# Patient Record
Sex: Male | Born: 1975 | Race: White | Hispanic: No | Marital: Married | Smoking: Former smoker
Health system: Southern US, Community
[De-identification: ages and names within clinical notes are randomized; demographics above are authoritative.]

## PROBLEM LIST (undated history)

## (undated) DIAGNOSIS — N200 Calculus of kidney: Secondary | ICD-10-CM

## (undated) HISTORY — DX: Calculus of kidney: N20.0

---

## 2005-10-31 DIAGNOSIS — N200 Calculus of kidney: Secondary | ICD-10-CM

## 2005-10-31 HISTORY — DX: Calculus of kidney: N20.0

## 2016-10-20 ENCOUNTER — Telehealth: Payer: Self-pay | Admitting: General Practice

## 2016-10-20 NOTE — Telephone Encounter (Signed)
Patient would like to know if Dr. Posey ReaPlotnikov would take him and his wife on as a patient b/c his wife does not speak english good but does speak [place].

## 2016-11-02 NOTE — Telephone Encounter (Signed)
Ok Thx 

## 2016-11-08 NOTE — Telephone Encounter (Signed)
Left message for patient to call back to schedule.  °

## 2016-11-24 ENCOUNTER — Ambulatory Visit: Payer: Self-pay | Admitting: Internal Medicine

## 2016-11-24 ENCOUNTER — Encounter: Payer: Self-pay | Admitting: Internal Medicine

## 2016-11-24 ENCOUNTER — Ambulatory Visit (INDEPENDENT_AMBULATORY_CARE_PROVIDER_SITE_OTHER): Payer: Managed Care, Other (non HMO) | Admitting: Internal Medicine

## 2016-11-24 ENCOUNTER — Other Ambulatory Visit (INDEPENDENT_AMBULATORY_CARE_PROVIDER_SITE_OTHER): Payer: Managed Care, Other (non HMO)

## 2016-11-24 DIAGNOSIS — Z0001 Encounter for general adult medical examination with abnormal findings: Secondary | ICD-10-CM

## 2016-11-24 DIAGNOSIS — R197 Diarrhea, unspecified: Secondary | ICD-10-CM | POA: Insufficient documentation

## 2016-11-24 DIAGNOSIS — Z0000 Encounter for general adult medical examination without abnormal findings: Secondary | ICD-10-CM

## 2016-11-24 DIAGNOSIS — N200 Calculus of kidney: Secondary | ICD-10-CM

## 2016-11-24 DIAGNOSIS — G8929 Other chronic pain: Secondary | ICD-10-CM

## 2016-11-24 DIAGNOSIS — R1013 Epigastric pain: Secondary | ICD-10-CM

## 2016-11-24 LAB — BASIC METABOLIC PANEL
BUN: 16 mg/dL (ref 6–23)
CO2: 30 meq/L (ref 19–32)
Calcium: 9.8 mg/dL (ref 8.4–10.5)
Chloride: 107 mEq/L (ref 96–112)
Creatinine, Ser: 0.97 mg/dL (ref 0.40–1.50)
GFR: 91.6 mL/min (ref >60.00)
GLUCOSE: 105 mg/dL — AB (ref 70–99)
POTASSIUM: 4.7 meq/L (ref 3.5–5.1)
SODIUM: 143 meq/L (ref 135–145)

## 2016-11-24 LAB — CBC WITH DIFFERENTIAL/PLATELET
Basophils Absolute: 0 10*3/uL (ref 0.0–0.1)
Basophils Relative: 0.4 % (ref 0.0–3.0)
EOS PCT: 1.1 % (ref 0.0–5.0)
Eosinophils Absolute: 0.1 10*3/uL (ref 0.0–0.7)
HCT: 45.9 % (ref 39.0–52.0)
Hemoglobin: 16 g/dL (ref 13.0–17.0)
LYMPHS ABS: 2.2 10*3/uL (ref 0.7–4.0)
Lymphocytes Relative: 36.9 % (ref 12.0–46.0)
MCHC: 35 g/dL (ref 30.0–36.0)
MCV: 84.6 fl (ref 78.0–100.0)
MONOS PCT: 9.2 % (ref 3.0–12.0)
Monocytes Absolute: 0.6 10*3/uL (ref 0.1–1.0)
NEUTROS ABS: 3.2 10*3/uL (ref 1.4–7.7)
NEUTROS PCT: 52.4 % (ref 43.0–77.0)
Platelets: 192 10*3/uL (ref 150.0–400.0)
RBC: 5.42 Mil/uL (ref 4.22–5.81)
RDW: 12.6 % (ref 11.5–15.5)
WBC: 6.1 10*3/uL (ref 4.0–10.5)

## 2016-11-24 LAB — LIPID PANEL
CHOLESTEROL: 171 mg/dL (ref 0–200)
HDL: 34.4 mg/dL — ABNORMAL LOW (ref >39.00)
LDL Cholesterol: 100 mg/dL — ABNORMAL HIGH (ref 0–99)
NonHDL: 136.86
Total CHOL/HDL Ratio: 5
Triglycerides: 186 mg/dL — ABNORMAL HIGH (ref 0.0–149.0)
VLDL: 37.2 mg/dL (ref 0.0–40.0)

## 2016-11-24 LAB — HEPATIC FUNCTION PANEL
ALBUMIN: 4.6 g/dL (ref 3.5–5.2)
ALT: 26 U/L (ref 0–53)
AST: 16 U/L (ref 0–37)
Alkaline Phosphatase: 82 U/L (ref 39–117)
Bilirubin, Direct: 0.1 mg/dL (ref 0.0–0.3)
TOTAL PROTEIN: 7.6 g/dL (ref 6.0–8.3)
Total Bilirubin: 0.6 mg/dL (ref 0.2–1.2)

## 2016-11-24 LAB — URINALYSIS
BILIRUBIN URINE: NEGATIVE
Hgb urine dipstick: NEGATIVE
KETONES UR: NEGATIVE
Leukocytes, UA: NEGATIVE
Nitrite: NEGATIVE
PH: 7 (ref 5.0–8.0)
SPECIFIC GRAVITY, URINE: 1.02 (ref 1.000–1.030)
TOTAL PROTEIN, URINE-UPE24: NEGATIVE
Urine Glucose: NEGATIVE
Urobilinogen, UA: 0.2 (ref 0.0–1.0)

## 2016-11-24 LAB — TSH: TSH: 0.97 u[IU]/mL (ref 0.35–4.50)

## 2016-11-24 MED ORDER — VITAMIN D 1000 UNITS PO TABS: 1000.0000 [IU] | ORAL_TABLET | Freq: Every day | ORAL | 3 refills | Status: AC

## 2016-11-24 NOTE — Assessment & Plan Note (Signed)
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

## 2016-11-24 NOTE — Assessment & Plan Note (Signed)
Episodic - ?lactose intol vs other discussed

## 2016-11-24 NOTE — Progress Notes (Signed)
   Subjective:  Patient ID: Pryor Ochoagor Allshouse, male    DOB: 03-21-1976  Age: 41 y.o. MRN: [id number]  CC: Establish Care   HPI Pryor Ochoagor Bangert presents for a well exam H/o R kidney stone C/o occ abd pain and diarrhea off and on after eating out - long time  No outpatient prescriptions prior to visit.   No facility-administered medications prior to visit.     ROS Review of Systems  Constitutional: Negative for appetite change, fatigue and unexpected weight change.  HENT: Negative for congestion, nosebleeds, sneezing, sore throat and trouble swallowing.   Eyes: Negative for itching and visual disturbance.  Respiratory: Negative for cough.   Cardiovascular: Negative for chest pain, palpitations and leg swelling.  Gastrointestinal: Positive for abdominal pain and diarrhea. Negative for abdominal distention, blood in stool and nausea.  Genitourinary: Negative for frequency and hematuria.  Musculoskeletal: Negative for back pain, gait problem, joint swelling and neck pain.  Skin: Negative for rash.  Neurological: Negative for dizziness, tremors, speech difficulty and weakness.  Psychiatric/Behavioral: Negative for agitation, dysphoric mood, sleep disturbance and suicidal ideas. The patient is not nervous/anxious.     Objective:  BP 140/90   Pulse 81   Ht 5' 9.5" (1.765 m)   Wt 211 lb (95.7 kg)   SpO2 98%   BMI 30.71 kg/m   BP Readings from Last 3 Encounters:  11/24/16 140/90    Wt Readings from Last 3 Encounters:  11/24/16 211 lb (95.7 kg)    Physical Exam  Constitutional: He is oriented to person, place, and time. He appears well-developed. No distress.  NAD  HENT:  Mouth/Throat: Oropharynx is clear and moist.  Eyes: Conjunctivae are normal. Pupils are equal, round, and reactive to light.  Neck: Normal range of motion. No JVD present. No thyromegaly present.  Cardiovascular: Normal rate, regular rhythm, normal heart sounds and intact distal pulses.  Exam reveals no gallop  and no friction rub.   No murmur heard. Pulmonary/Chest: Effort normal and breath sounds normal. No respiratory distress. He has no wheezes. He has no rales. He exhibits no tenderness.  Abdominal: Soft. Bowel sounds are normal. He exhibits no distension and no mass. There is no tenderness. There is no rebound and no guarding.  Musculoskeletal: Normal range of motion. He exhibits no edema or tenderness.  Lymphadenopathy:    He has no cervical adenopathy.  Neurological: He is alert and oriented to person, place, and time. He has normal reflexes. No cranial nerve deficit. He exhibits normal muscle tone. He displays a negative Romberg sign. Coordination and gait normal.  Skin: Skin is warm and dry. No rash noted.  Psychiatric: He has a normal mood and affect. His behavior is normal. Judgment and thought content normal.    No results found for: WBC, HGB, HCT, PLT, GLUCOSE, CHOL, TRIG, HDL, LDLDIRECT, LDLCALC, ALT, AST, NA, K, CL, CREATININE, BUN, CO2, TSH, PSA, INR, GLUF, HGBA1C, MICROALBUR  Patient was never admitted.  Assessment & Plan:   There are no diagnoses linked to this encounter. Mr. Tacey RuizZheldakov does not currently have medications on file.  No orders of the defined types were placed in this encounter.    Follow-up: No Follow-up on file.  Sonda PrimesAlex Willliam Pettet, MD

## 2016-11-24 NOTE — Patient Instructions (Addendum)
OTC Nexium   Check for Helicobacter pylori  Lactose intolerance Radiation dose

## 2016-11-24 NOTE — Assessment & Plan Note (Addendum)
We discussed age appropriate health related issues, including available/recomended screening tests and vaccinations. We discussed a need for adhering to healthy diet and exercise. Labs/EKG were reviewed/ordered. All questions were answered. H/o XRT exposure in [place] - he will calculate the dose

## 2016-11-24 NOTE — Progress Notes (Signed)
Pre visit review using our clinic review tool, if applicable. No additional management support is needed unless otherwise documented below in the visit note. 

## 2016-11-24 NOTE — Assessment & Plan Note (Addendum)
2007 R W/u in [place]. No relapse

## 2017-11-30 ENCOUNTER — Other Ambulatory Visit (INDEPENDENT_AMBULATORY_CARE_PROVIDER_SITE_OTHER): Payer: Managed Care, Other (non HMO)

## 2017-11-30 ENCOUNTER — Ambulatory Visit (INDEPENDENT_AMBULATORY_CARE_PROVIDER_SITE_OTHER): Payer: Managed Care, Other (non HMO) | Admitting: Internal Medicine

## 2017-11-30 ENCOUNTER — Encounter: Payer: Self-pay | Admitting: Internal Medicine

## 2017-11-30 VITALS — BP 122/78 | HR 68 | Temp 97.9°F | Ht 69.5 in | Wt 209.0 lb

## 2017-11-30 DIAGNOSIS — Z23 Encounter for immunization: Secondary | ICD-10-CM

## 2017-11-30 DIAGNOSIS — E785 Hyperlipidemia, unspecified: Secondary | ICD-10-CM

## 2017-11-30 DIAGNOSIS — Z0000 Encounter for general adult medical examination without abnormal findings: Secondary | ICD-10-CM

## 2017-11-30 LAB — BASIC METABOLIC PANEL
BUN: 20 mg/dL (ref 6–23)
CHLORIDE: 105 meq/L (ref 96–112)
CO2: 28 meq/L (ref 19–32)
Calcium: 9.5 mg/dL (ref 8.4–10.5)
Creatinine, Ser: 0.95 mg/dL (ref 0.40–1.50)
GFR: 93.34 mL/min (ref >60.00)
GLUCOSE: 109 mg/dL — AB (ref 70–99)
Potassium: 4.1 mEq/L (ref 3.5–5.1)
SODIUM: 140 meq/L (ref 135–145)

## 2017-11-30 LAB — HEPATIC FUNCTION PANEL
ALBUMIN: 4.5 g/dL (ref 3.5–5.2)
ALK PHOS: 76 U/L (ref 39–117)
ALT: 33 U/L (ref 0–53)
AST: 18 U/L (ref 0–37)
BILIRUBIN TOTAL: 0.8 mg/dL (ref 0.2–1.2)
Bilirubin, Direct: 0.2 mg/dL (ref 0.0–0.3)
Total Protein: 7.7 g/dL (ref 6.0–8.3)

## 2017-11-30 LAB — CBC WITH DIFFERENTIAL/PLATELET
BASOS PCT: 0.4 % (ref 0.0–3.0)
Basophils Absolute: 0 10*3/uL (ref 0.0–0.1)
EOS PCT: 1.7 % (ref 0.0–5.0)
Eosinophils Absolute: 0.1 10*3/uL (ref 0.0–0.7)
HEMATOCRIT: 46.8 % (ref 39.0–52.0)
HEMOGLOBIN: 15.7 g/dL (ref 13.0–17.0)
LYMPHS ABS: 2.2 10*3/uL (ref 0.7–4.0)
Lymphocytes Relative: 39.5 % (ref 12.0–46.0)
MCHC: 33.6 g/dL (ref 30.0–36.0)
MCV: 87.3 fl (ref 78.0–100.0)
MONOS PCT: 8.6 % (ref 3.0–12.0)
Monocytes Absolute: 0.5 10*3/uL (ref 0.1–1.0)
Neutro Abs: 2.8 10*3/uL (ref 1.4–7.7)
Neutrophils Relative %: 49.8 % (ref 43.0–77.0)
PLATELETS: 191 10*3/uL (ref 150.0–400.0)
RBC: 5.36 Mil/uL (ref 4.22–5.81)
RDW: 12.8 % (ref 11.5–15.5)
WBC: 5.6 10*3/uL (ref 4.0–10.5)

## 2017-11-30 LAB — LIPID PANEL
CHOLESTEROL: 197 mg/dL (ref 0–200)
HDL: 40.1 mg/dL (ref >39.00)
LDL Cholesterol: 140 mg/dL — ABNORMAL HIGH (ref 0–99)
NONHDL: 157.08
Total CHOL/HDL Ratio: 5
Triglycerides: 86 mg/dL (ref 0.0–149.0)
VLDL: 17.2 mg/dL (ref 0.0–40.0)

## 2017-11-30 LAB — URINALYSIS
BILIRUBIN URINE: NEGATIVE
HGB URINE DIPSTICK: NEGATIVE
KETONES UR: NEGATIVE
Leukocytes, UA: NEGATIVE
Nitrite: NEGATIVE
Specific Gravity, Urine: 1.025 (ref 1.000–1.030)
TOTAL PROTEIN, URINE-UPE24: NEGATIVE
URINE GLUCOSE: NEGATIVE
UROBILINOGEN UA: 0.2 (ref 0.0–1.0)
pH: 6.5 (ref 5.0–8.0)

## 2017-11-30 LAB — TSH: TSH: 0.92 u[IU]/mL (ref 0.35–4.50)

## 2017-11-30 NOTE — Progress Notes (Signed)
Subjective:  Patient ID: Adam Romero, male    DOB: December 01, 1975  Age: 42 y.o. MRN: [id number]  CC: No chief complaint on file.   HPI Ulrich Soules presents for a well exam  No outpatient medications prior to visit.   No facility-administered medications prior to visit.     ROS Review of Systems  Constitutional: Negative for appetite change, fatigue and unexpected weight change.  HENT: Negative for congestion, nosebleeds, sneezing, sore throat and trouble swallowing.   Eyes: Negative for itching and visual disturbance.  Respiratory: Negative for cough.   Cardiovascular: Negative for chest pain, palpitations and leg swelling.  Gastrointestinal: Negative for abdominal distention, blood in stool, diarrhea and nausea.  Genitourinary: Negative for frequency and hematuria.  Musculoskeletal: Negative for back pain, gait problem, joint swelling and neck pain.  Skin: Negative for rash.  Neurological: Negative for dizziness, tremors, speech difficulty and weakness.  Psychiatric/Behavioral: Negative for agitation, dysphoric mood and sleep disturbance. The patient is not nervous/anxious.     Objective:  BP 122/78 (BP Location: Left Arm, Patient Position: Sitting, Cuff Size: Large)   Pulse 68   Temp 97.9 F (36.6 C) (Oral)   Ht 5' 9.5" (1.765 m)   Wt 209 lb (94.8 kg)   SpO2 99%   BMI 30.42 kg/m   BP Readings from Last 3 Encounters:  11/30/17 122/78  11/24/16 140/90    Wt Readings from Last 3 Encounters:  11/30/17 209 lb (94.8 kg)  11/24/16 211 lb (95.7 kg)    Physical Exam  Constitutional: He is oriented to person, place, and time. He appears well-developed. No distress.  NAD  HENT:  Mouth/Throat: Oropharynx is clear and moist.  Eyes: Conjunctivae are normal. Pupils are equal, round, and reactive to light.  Neck: Normal range of motion. No JVD present. No thyromegaly present.  Cardiovascular: Normal rate, regular rhythm, normal heart sounds and intact distal pulses.  Exam reveals no gallop and no friction rub.  No murmur heard. Pulmonary/Chest: Effort normal and breath sounds normal. No respiratory distress. He has no wheezes. He has no rales. He exhibits no tenderness.  Abdominal: Soft. Bowel sounds are normal. He exhibits no distension and no mass. There is no tenderness. There is no rebound and no guarding.  Musculoskeletal: Normal range of motion. He exhibits no edema or tenderness.  Lymphadenopathy:    He has no cervical adenopathy.  Neurological: He is alert and oriented to person, place, and time. He has normal reflexes. No cranial nerve deficit. He exhibits normal muscle tone. He displays a negative Romberg sign. Coordination and gait normal.  Skin: Skin is warm and dry. No rash noted.  Psychiatric: He has a normal mood and affect. His behavior is normal. Judgment and thought content normal.  self exam - testes  Lab Results  Component Value Date   WBC 6.1 11/24/2016   HGB 16.0 11/24/2016   HCT 45.9 11/24/2016   PLT 192.0 11/24/2016   GLUCOSE 105 (H) 11/24/2016   CHOL 171 11/24/2016   TRIG 186.0 (H) 11/24/2016   HDL 34.40 (L) 11/24/2016   LDLCALC 100 (H) 11/24/2016   ALT 26 11/24/2016   AST 16 11/24/2016   NA 143 11/24/2016   K 4.7 11/24/2016   CL 107 11/24/2016   CREATININE 0.97 11/24/2016   BUN 16 11/24/2016   CO2 30 11/24/2016   TSH 0.97 11/24/2016    Patient was never admitted.  Assessment & Plan:   There are no diagnoses linked to this encounter. Bastien Colgate  does not currently have medications on file.  No orders of the defined types were placed in this encounter.    Follow-up: No Follow-up on file.  Sonda PrimesAlex Bernardette Waldron, MD

## 2017-11-30 NOTE — Assessment & Plan Note (Addendum)
Labs  We discussed age appropriate health related issues, including available/recomended screening tests and vaccinations. We discussed a need for adhering to healthy diet and exercise. Labs/EKG were reviewed/ordered. All questions were answered.

## 2017-12-23 ENCOUNTER — Encounter: Payer: Self-pay | Admitting: Internal Medicine

## 2018-12-06 ENCOUNTER — Encounter: Payer: Self-pay | Admitting: Internal Medicine

## 2018-12-06 ENCOUNTER — Ambulatory Visit (INDEPENDENT_AMBULATORY_CARE_PROVIDER_SITE_OTHER): Payer: Managed Care, Other (non HMO) | Admitting: Internal Medicine

## 2018-12-06 ENCOUNTER — Other Ambulatory Visit (INDEPENDENT_AMBULATORY_CARE_PROVIDER_SITE_OTHER): Payer: Managed Care, Other (non HMO)

## 2018-12-06 DIAGNOSIS — Z0000 Encounter for general adult medical examination without abnormal findings: Secondary | ICD-10-CM | POA: Diagnosis not present

## 2018-12-06 LAB — BASIC METABOLIC PANEL
BUN: 15 mg/dL (ref 6–23)
CO2: 29 meq/L (ref 19–32)
Calcium: 9.9 mg/dL (ref 8.4–10.5)
Chloride: 105 mEq/L (ref 96–112)
Creatinine, Ser: 1.04 mg/dL (ref 0.40–1.50)
GFR: 78.71 mL/min (ref >60.00)
Glucose, Bld: 101 mg/dL — ABNORMAL HIGH (ref 70–99)
Potassium: 4.9 mEq/L (ref 3.5–5.1)
Sodium: 141 mEq/L (ref 135–145)

## 2018-12-06 LAB — URINALYSIS
Bilirubin Urine: NEGATIVE
Hgb urine dipstick: NEGATIVE
KETONES UR: NEGATIVE
LEUKOCYTES UA: NEGATIVE
Nitrite: NEGATIVE
Specific Gravity, Urine: 1.02 (ref 1.000–1.030)
Total Protein, Urine: NEGATIVE
Urine Glucose: NEGATIVE
Urobilinogen, UA: 0.2 (ref 0.0–1.0)
pH: 7 (ref 5.0–8.0)

## 2018-12-06 LAB — HEPATIC FUNCTION PANEL
ALBUMIN: 4.7 g/dL (ref 3.5–5.2)
ALT: 31 U/L (ref 0–53)
AST: 19 U/L (ref 0–37)
Alkaline Phosphatase: 78 U/L (ref 39–117)
Bilirubin, Direct: 0.1 mg/dL (ref 0.0–0.3)
TOTAL PROTEIN: 7.2 g/dL (ref 6.0–8.3)
Total Bilirubin: 0.7 mg/dL (ref 0.2–1.2)

## 2018-12-06 LAB — CBC WITH DIFFERENTIAL/PLATELET
BASOS ABS: 0 10*3/uL (ref 0.0–0.1)
Basophils Relative: 0.4 % (ref 0.0–3.0)
EOS ABS: 0.1 10*3/uL (ref 0.0–0.7)
Eosinophils Relative: 1.9 % (ref 0.0–5.0)
HCT: 47.7 % (ref 39.0–52.0)
Hemoglobin: 16.2 g/dL (ref 13.0–17.0)
Lymphocytes Relative: 36.2 % (ref 12.0–46.0)
Lymphs Abs: 2.2 10*3/uL (ref 0.7–4.0)
MCHC: 34 g/dL (ref 30.0–36.0)
MCV: 86.3 fl (ref 78.0–100.0)
MONO ABS: 0.5 10*3/uL (ref 0.1–1.0)
Monocytes Relative: 8.6 % (ref 3.0–12.0)
Neutro Abs: 3.2 10*3/uL (ref 1.4–7.7)
Neutrophils Relative %: 52.9 % (ref 43.0–77.0)
Platelets: 187 10*3/uL (ref 150.0–400.0)
RBC: 5.53 Mil/uL (ref 4.22–5.81)
RDW: 13.3 % (ref 11.5–15.5)
WBC: 6 10*3/uL (ref 4.0–10.5)

## 2018-12-06 LAB — LIPID PANEL
Cholesterol: 175 mg/dL (ref 0–200)
HDL: 32.7 mg/dL — ABNORMAL LOW (ref >39.00)
LDL Cholesterol: 117 mg/dL — ABNORMAL HIGH (ref 0–99)
NonHDL: 142.32
Total CHOL/HDL Ratio: 5
Triglycerides: 128 mg/dL (ref 0.0–149.0)
VLDL: 25.6 mg/dL (ref 0.0–40.0)

## 2018-12-06 LAB — TSH: TSH: 1.05 u[IU]/mL (ref 0.35–4.50)

## 2018-12-06 NOTE — Assessment & Plan Note (Signed)
We discussed age appropriate health related issues, including available/recomended screening tests and vaccinations. We discussed a need for adhering to healthy diet and exercise. Labs/EKG were reviewed/ordered. All questions were answered.  Remote h/o XRT exposure in [place] - he will calculate the dose

## 2018-12-06 NOTE — Progress Notes (Signed)
Subjective:  Patient ID: Adam Romero, male    DOB: 08-03-1976  Age: 43 y.o. MRN: [id number]  CC: No chief complaint on file.   HPI Adam Romero presents for a well exam  No outpatient medications prior to visit.   No facility-administered medications prior to visit.     ROS: Review of Systems  Constitutional: Negative for appetite change, fatigue and unexpected weight change.  HENT: Negative for congestion, nosebleeds, sneezing, sore throat and trouble swallowing.   Eyes: Negative for itching and visual disturbance.  Respiratory: Negative for cough.   Cardiovascular: Negative for chest pain, palpitations and leg swelling.  Gastrointestinal: Negative for abdominal distention, blood in stool, diarrhea and nausea.  Genitourinary: Negative for frequency and hematuria.  Musculoskeletal: Negative for back pain, gait problem, joint swelling and neck pain.  Skin: Negative for rash.  Neurological: Negative for dizziness, tremors, speech difficulty and weakness.  Psychiatric/Behavioral: Negative for agitation, dysphoric mood and sleep disturbance. The patient is not nervous/anxious.     Objective:  BP 124/82 (BP Location: Left Arm, Patient Position: Sitting, Cuff Size: Large)   Pulse 73   Temp 98 F (36.7 C) (Oral)   Ht 5' 9.5" (1.765 m)   Wt 214 lb (97.1 kg)   SpO2 97%   BMI 31.15 kg/m   BP Readings from Last 3 Encounters:  12/06/18 124/82  11/30/17 122/78  11/24/16 140/90    Wt Readings from Last 3 Encounters:  12/06/18 214 lb (97.1 kg)  11/30/17 209 lb (94.8 kg)  11/24/16 211 lb (95.7 kg)    Physical Exam Constitutional:      General: He is not in acute distress.    Appearance: He is well-developed.     Comments: NAD  Eyes:     Conjunctiva/sclera: Conjunctivae normal.     Pupils: Pupils are equal, round, and reactive to light.  Neck:     Musculoskeletal: Normal range of motion.     Thyroid: No thyromegaly.     Vascular: No JVD.  Cardiovascular:   Rate and Rhythm: Normal rate and regular rhythm.     Heart sounds: Normal heart sounds. No murmur. No friction rub. No gallop.   Pulmonary:     Effort: Pulmonary effort is normal. No respiratory distress.     Breath sounds: Normal breath sounds. No wheezing or rales.  Chest:     Chest wall: No tenderness.  Abdominal:     General: Bowel sounds are normal. There is no distension.     Palpations: Abdomen is soft. There is no mass.     Tenderness: There is no abdominal tenderness. There is no guarding or rebound.  Musculoskeletal: Normal range of motion.        General: No tenderness.  Lymphadenopathy:     Cervical: No cervical adenopathy.  Skin:    General: Skin is warm and dry.     Findings: No rash.  Neurological:     Mental Status: He is alert and oriented to person, place, and time.     Cranial Nerves: No cranial nerve deficit.     Motor: No abnormal muscle tone.     Coordination: Coordination normal.     Gait: Gait normal.     Deep Tendon Reflexes: Reflexes are normal and symmetric.  Psychiatric:        Behavior: Behavior normal.        Thought Content: Thought content normal.        Judgment: Judgment normal.     Lab Results  Component Value Date   WBC 5.6 11/30/2017   HGB 15.7 11/30/2017   HCT 46.8 11/30/2017   PLT 191.0 11/30/2017   GLUCOSE 109 (H) 11/30/2017   CHOL 197 11/30/2017   TRIG 86.0 11/30/2017   HDL 40.10 11/30/2017   LDLCALC 140 (H) 11/30/2017   ALT 33 11/30/2017   AST 18 11/30/2017   NA 140 11/30/2017   K 4.1 11/30/2017   CL 105 11/30/2017   CREATININE 0.95 11/30/2017   BUN 20 11/30/2017   CO2 28 11/30/2017   TSH 0.92 11/30/2017    Patient was never admitted.  Assessment & Plan:   There are no diagnoses linked to this encounter.   No orders of the defined types were placed in this encounter.    Follow-up: No follow-ups on file.  Sonda PrimesAlex , MD

## 2019-03-18 ENCOUNTER — Ambulatory Visit: Payer: Managed Care, Other (non HMO) | Admitting: Internal Medicine

## 2019-12-12 ENCOUNTER — Other Ambulatory Visit: Payer: Self-pay

## 2019-12-12 ENCOUNTER — Ambulatory Visit (INDEPENDENT_AMBULATORY_CARE_PROVIDER_SITE_OTHER): Payer: Managed Care, Other (non HMO) | Admitting: Internal Medicine

## 2019-12-12 ENCOUNTER — Ambulatory Visit (INDEPENDENT_AMBULATORY_CARE_PROVIDER_SITE_OTHER): Payer: Managed Care, Other (non HMO)

## 2019-12-12 ENCOUNTER — Encounter: Payer: Self-pay | Admitting: Internal Medicine

## 2019-12-12 VITALS — BP 124/72 | HR 79 | Temp 98.0°F | Ht 69.5 in | Wt 208.0 lb

## 2019-12-12 DIAGNOSIS — R0789 Other chest pain: Secondary | ICD-10-CM

## 2019-12-12 DIAGNOSIS — Z0000 Encounter for general adult medical examination without abnormal findings: Secondary | ICD-10-CM | POA: Diagnosis not present

## 2019-12-12 LAB — LIPID PANEL
Cholesterol: 161 mg/dL (ref 0–200)
HDL: 34.4 mg/dL — ABNORMAL LOW (ref >39.00)
LDL Cholesterol: 97 mg/dL (ref 0–99)
NonHDL: 126.42
Total CHOL/HDL Ratio: 5
Triglycerides: 147 mg/dL (ref 0.0–149.0)
VLDL: 29.4 mg/dL (ref 0.0–40.0)

## 2019-12-12 LAB — URINALYSIS
Bilirubin Urine: NEGATIVE
Hgb urine dipstick: NEGATIVE
Ketones, ur: NEGATIVE
Leukocytes,Ua: NEGATIVE
Nitrite: NEGATIVE
Specific Gravity, Urine: >=1.03 — AB (ref 1.000–1.030)
Total Protein, Urine: NEGATIVE
Urine Glucose: NEGATIVE
Urobilinogen, UA: 0.2 (ref 0.0–1.0)
pH: 5.5 (ref 5.0–8.0)

## 2019-12-12 LAB — CBC WITH DIFFERENTIAL/PLATELET
Basophils Absolute: 0 10*3/uL (ref 0.0–0.1)
Basophils Relative: 0.4 % (ref 0.0–3.0)
Eosinophils Absolute: 0.1 10*3/uL (ref 0.0–0.7)
Eosinophils Relative: 1.4 % (ref 0.0–5.0)
HCT: 46.1 % (ref 39.0–52.0)
Hemoglobin: 15.5 g/dL (ref 13.0–17.0)
Lymphocytes Relative: 32.5 % (ref 12.0–46.0)
Lymphs Abs: 1.9 10*3/uL (ref 0.7–4.0)
MCHC: 33.7 g/dL (ref 30.0–36.0)
MCV: 85.9 fl (ref 78.0–100.0)
Monocytes Absolute: 0.5 10*3/uL (ref 0.1–1.0)
Monocytes Relative: 8.1 % (ref 3.0–12.0)
Neutro Abs: 3.3 10*3/uL (ref 1.4–7.7)
Neutrophils Relative %: 57.6 % (ref 43.0–77.0)
Platelets: 197 10*3/uL (ref 150.0–400.0)
RBC: 5.36 Mil/uL (ref 4.22–5.81)
RDW: 12.9 % (ref 11.5–15.5)
WBC: 5.7 10*3/uL (ref 4.0–10.5)

## 2019-12-12 LAB — HEPATIC FUNCTION PANEL
ALT: 25 U/L (ref 0–53)
AST: 16 U/L (ref 0–37)
Albumin: 4.5 g/dL (ref 3.5–5.2)
Alkaline Phosphatase: 76 U/L (ref 39–117)
Bilirubin, Direct: 0.1 mg/dL (ref 0.0–0.3)
Total Bilirubin: 0.7 mg/dL (ref 0.2–1.2)
Total Protein: 7.2 g/dL (ref 6.0–8.3)

## 2019-12-12 LAB — BASIC METABOLIC PANEL
BUN: 18 mg/dL (ref 6–23)
CO2: 28 mEq/L (ref 19–32)
Calcium: 9.5 mg/dL (ref 8.4–10.5)
Chloride: 106 mEq/L (ref 96–112)
Creatinine, Ser: 1 mg/dL (ref 0.40–1.50)
GFR: 81.94 mL/min (ref >60.00)
Glucose, Bld: 104 mg/dL — ABNORMAL HIGH (ref 70–99)
Potassium: 4.1 mEq/L (ref 3.5–5.1)
Sodium: 140 mEq/L (ref 135–145)

## 2019-12-12 LAB — TSH: TSH: 1.03 u[IU]/mL (ref 0.35–4.50)

## 2019-12-12 LAB — PSA: PSA: 0.53 ng/mL (ref 0.10–4.00)

## 2019-12-12 MED ORDER — VITAMIN D3 50 MCG (2000 UT) PO CAPS: 2000.0000 [IU] | ORAL_CAPSULE | Freq: Every day | ORAL | 3 refills | Status: DC

## 2019-12-12 NOTE — Assessment & Plan Note (Addendum)
CP episode 3 mo ago w/HR 100 - he did not feel right for a few hrs... EKG CXR Labs RTC if re-occurred  Start exercising

## 2019-12-12 NOTE — Assessment & Plan Note (Addendum)
We discussed age appropriate health related issues, including available/recomended screening tests and vaccinations. We discussed a need for adhering to healthy diet and exercise. Labs were ordered to be later reviewed . All questions were answered. Start exercising

## 2019-12-12 NOTE — Progress Notes (Signed)
Subjective:  Patient ID: Adam Romero, male    DOB: 1975/12/07  Age: 44 y.o. MRN: [id number]  CC: No chief complaint on file.   HPI Adam Romero presents for a well exam C/o CP episode 3 mo ago w/HR 100 - he did not feel right for a few hrs... No fam h/o CAD No h/o PUD  ROS: Review of Systems  Constitutional: Negative for appetite change, fatigue and unexpected weight change.  HENT: Negative for congestion, nosebleeds, sneezing, sore throat and trouble swallowing.   Eyes: Negative for itching and visual disturbance.  Respiratory: Negative for cough.   Cardiovascular: Negative for chest pain, palpitations and leg swelling.  Gastrointestinal: Negative for abdominal distention, blood in stool, diarrhea and nausea.  Genitourinary: Negative for frequency and hematuria.  Musculoskeletal: Negative for back pain, gait problem, joint swelling and neck pain.  Skin: Negative for rash.  Neurological: Negative for dizziness, tremors, speech difficulty and weakness.  Psychiatric/Behavioral: Negative for agitation, dysphoric mood, sleep disturbance and suicidal ideas. The patient is not nervous/anxious.     Objective:  BP 124/72 (BP Location: Left Arm, Patient Position: Sitting, Cuff Size: Large)   Pulse 79   Temp 98 F (36.7 C) (Oral)   Ht 5' 9.5" (1.765 m)   Wt 208 lb (94.3 kg)   SpO2 97%   BMI 30.28 kg/m   BP Readings from Last 3 Encounters:  12/12/19 124/72  12/06/18 124/82  11/30/17 122/78    Wt Readings from Last 3 Encounters:  12/12/19 208 lb (94.3 kg)  12/06/18 214 lb (97.1 kg)  11/30/17 209 lb (94.8 kg)    Physical Exam Constitutional:      General: He is not in acute distress.    Appearance: He is well-developed.     Comments: NAD  Eyes:     Conjunctiva/sclera: Conjunctivae normal.     Pupils: Pupils are equal, round, and reactive to light.  Neck:     Thyroid: No thyromegaly.     Vascular: No JVD.  Cardiovascular:     Rate and Rhythm: Normal rate and  regular rhythm.     Heart sounds: Normal heart sounds. No murmur. No friction rub. No gallop.   Pulmonary:     Effort: Pulmonary effort is normal. No respiratory distress.     Breath sounds: Normal breath sounds. No wheezing or rales.  Chest:     Chest wall: No tenderness.  Abdominal:     General: Bowel sounds are normal. There is no distension.     Palpations: Abdomen is soft. There is no mass.     Tenderness: There is no abdominal tenderness. There is no guarding or rebound.  Musculoskeletal:        General: No tenderness. Normal range of motion.     Cervical back: Normal range of motion.  Lymphadenopathy:     Cervical: No cervical adenopathy.  Skin:    General: Skin is warm and dry.     Findings: No rash.  Neurological:     Mental Status: He is alert and oriented to person, place, and time.     Cranial Nerves: No cranial nerve deficit.     Motor: No abnormal muscle tone.     Coordination: Coordination normal.     Gait: Gait normal.     Deep Tendon Reflexes: Reflexes are normal and symmetric.  Psychiatric:        Behavior: Behavior normal.        Thought Content: Thought content normal.  Judgment: Judgment normal.   chest wall NT Procedure: EKG Indication: chest pain Impression: NSR. No acute changes.   Lab Results  Component Value Date   WBC 6.0 12/06/2018   HGB 16.2 12/06/2018   HCT 47.7 12/06/2018   PLT 187.0 12/06/2018   GLUCOSE 101 (H) 12/06/2018   CHOL 175 12/06/2018   TRIG 128.0 12/06/2018   HDL 32.70 (L) 12/06/2018   LDLCALC 117 (H) 12/06/2018   ALT 31 12/06/2018   AST 19 12/06/2018   NA 141 12/06/2018   K 4.9 12/06/2018   CL 105 12/06/2018   CREATININE 1.04 12/06/2018   BUN 15 12/06/2018   CO2 29 12/06/2018   TSH 1.05 12/06/2018    Patient was never admitted.  Assessment & Plan:    Walker Kehr, MD

## 2021-01-19 IMAGING — DX DG CHEST 2V
2 series · 2 of 2 positions shown · non-contrast
Comparison: None.

CLINICAL DATA: Routine. No complaints today. Pt c/o a day 3 months
ago of chest tightness that hs subsided. Ex smoker.

EXAM:
CHEST - 2 VIEW

[chest pa]
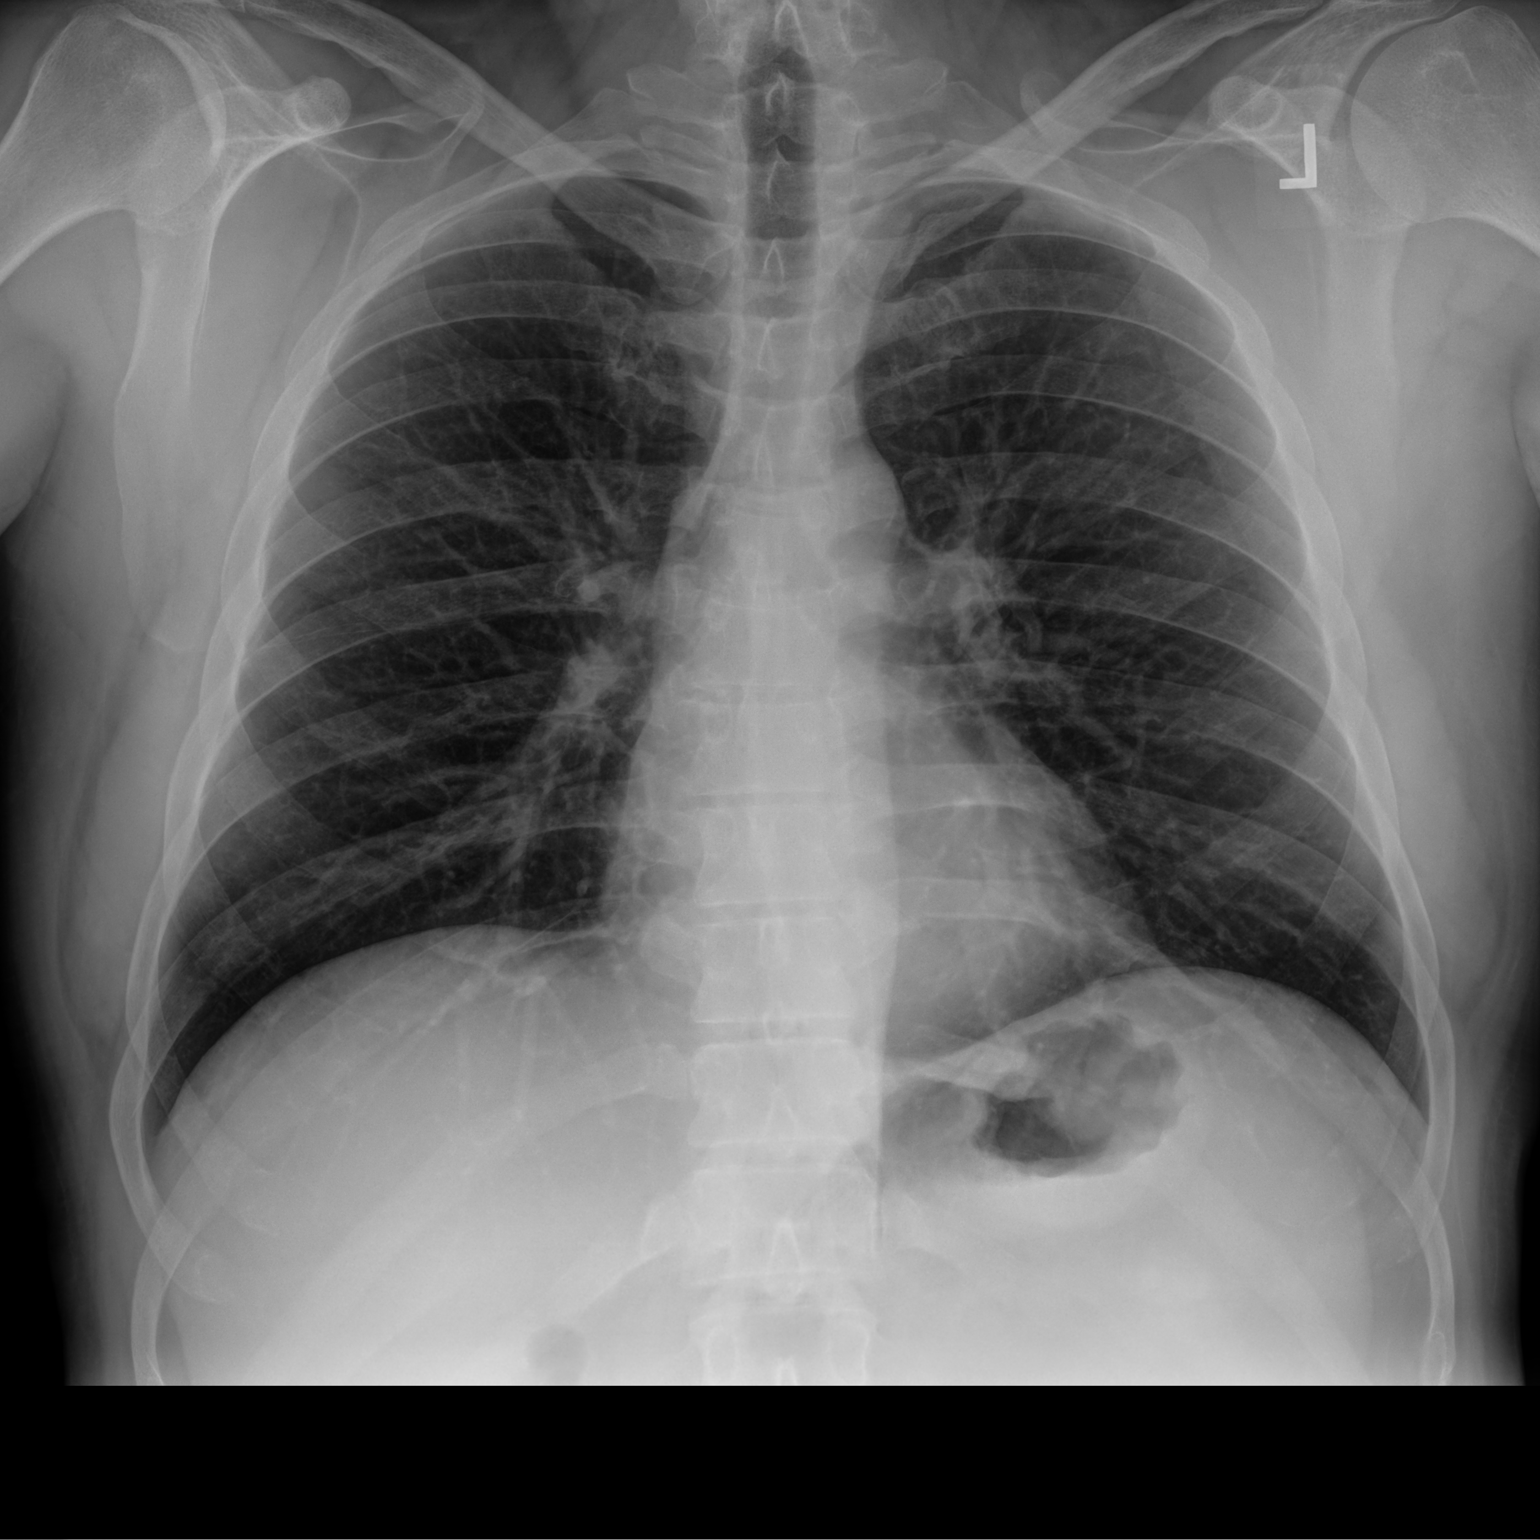

[chest lat]
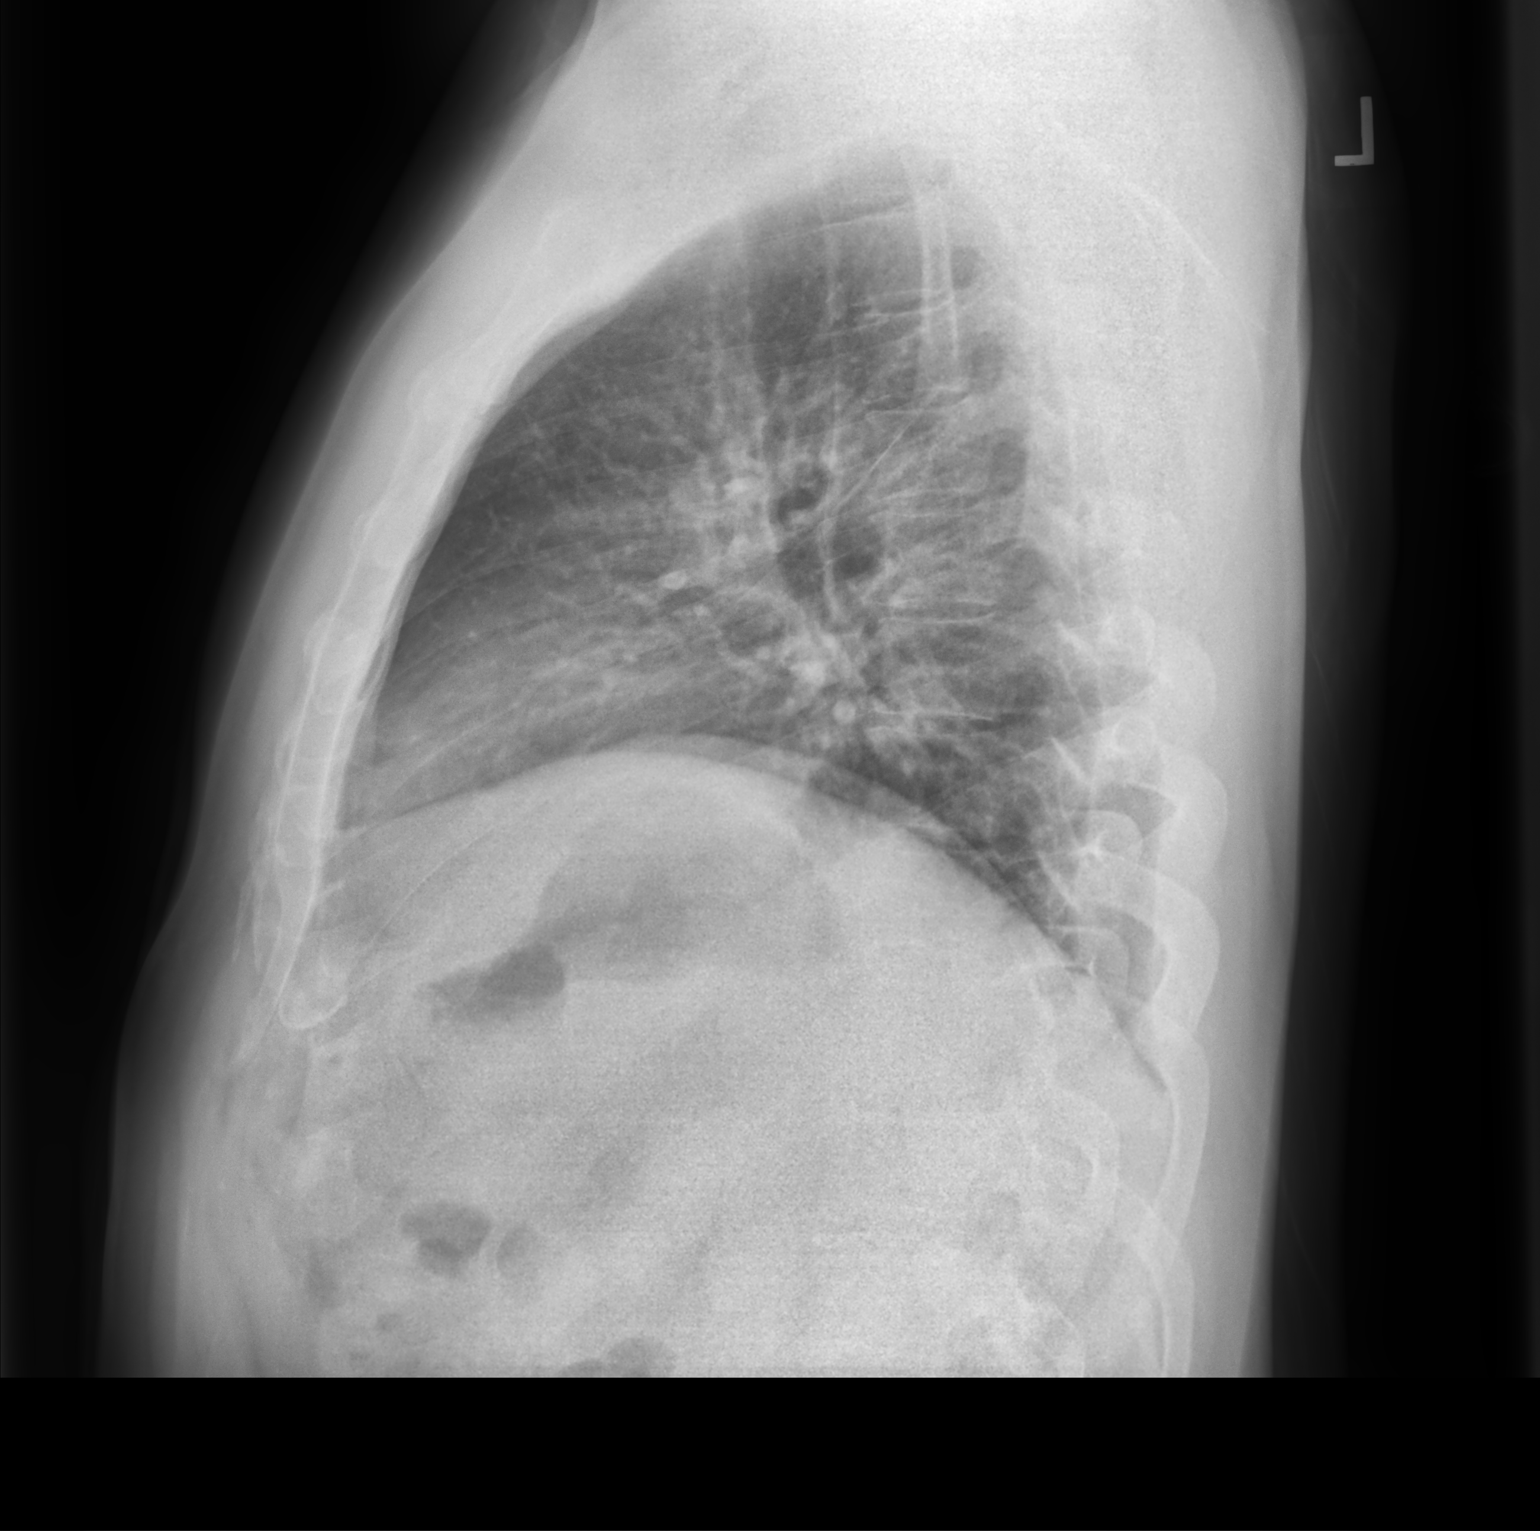

[2 of 2 positions shown; findings below may reference images not displayed]

FINDINGS: Normal heart, mediastinum and hila.

Clear lungs.  No pleural effusion or pneumothorax.

Skeletal structures are unremarkable.
IMPRESSION: No active cardiopulmonary disease.

## 2021-02-03 ENCOUNTER — Other Ambulatory Visit: Payer: Self-pay

## 2021-02-04 ENCOUNTER — Other Ambulatory Visit: Payer: Self-pay

## 2021-02-04 ENCOUNTER — Ambulatory Visit (INDEPENDENT_AMBULATORY_CARE_PROVIDER_SITE_OTHER): Payer: Managed Care, Other (non HMO) | Admitting: Internal Medicine

## 2021-02-04 ENCOUNTER — Encounter: Payer: Self-pay | Admitting: Internal Medicine

## 2021-02-04 DIAGNOSIS — Z125 Encounter for screening for malignant neoplasm of prostate: Secondary | ICD-10-CM

## 2021-02-04 DIAGNOSIS — Z0000 Encounter for general adult medical examination without abnormal findings: Secondary | ICD-10-CM

## 2021-02-04 LAB — URINALYSIS
Bilirubin Urine: NEGATIVE
Ketones, ur: NEGATIVE
Leukocytes,Ua: NEGATIVE
Nitrite: NEGATIVE
Specific Gravity, Urine: >=1.03 — AB (ref 1.000–1.030)
Total Protein, Urine: NEGATIVE
Urine Glucose: NEGATIVE
Urobilinogen, UA: 0.2 (ref 0.0–1.0)
pH: 5 (ref 5.0–8.0)

## 2021-02-04 LAB — CBC WITH DIFFERENTIAL/PLATELET
Basophils Absolute: 0 10*3/uL (ref 0.0–0.1)
Basophils Relative: 0.5 % (ref 0.0–3.0)
Eosinophils Absolute: 0.1 10*3/uL (ref 0.0–0.7)
Eosinophils Relative: 1.2 % (ref 0.0–5.0)
HCT: 45.8 % (ref 39.0–52.0)
Hemoglobin: 15.3 g/dL (ref 13.0–17.0)
Lymphocytes Relative: 33.1 % (ref 12.0–46.0)
Lymphs Abs: 2.1 10*3/uL (ref 0.7–4.0)
MCHC: 33.4 g/dL (ref 30.0–36.0)
MCV: 85.3 fl (ref 78.0–100.0)
Monocytes Absolute: 0.5 10*3/uL (ref 0.1–1.0)
Monocytes Relative: 8.1 % (ref 3.0–12.0)
Neutro Abs: 3.5 10*3/uL (ref 1.4–7.7)
Neutrophils Relative %: 57.1 % (ref 43.0–77.0)
Platelets: 184 10*3/uL (ref 150.0–400.0)
RBC: 5.37 Mil/uL (ref 4.22–5.81)
RDW: 13 % (ref 11.5–15.5)
WBC: 6.2 10*3/uL (ref 4.0–10.5)

## 2021-02-04 LAB — LIPID PANEL
Cholesterol: 159 mg/dL (ref 0–200)
HDL: 38.1 mg/dL — ABNORMAL LOW (ref >39.00)
LDL Cholesterol: 104 mg/dL — ABNORMAL HIGH (ref 0–99)
NonHDL: 120.43
Total CHOL/HDL Ratio: 4
Triglycerides: 81 mg/dL (ref 0.0–149.0)
VLDL: 16.2 mg/dL (ref 0.0–40.0)

## 2021-02-04 LAB — COMPREHENSIVE METABOLIC PANEL
ALT: 30 U/L (ref 0–53)
AST: 21 U/L (ref 0–37)
Albumin: 4.6 g/dL (ref 3.5–5.2)
Alkaline Phosphatase: 72 U/L (ref 39–117)
BUN: 20 mg/dL (ref 6–23)
CO2: 29 mEq/L (ref 19–32)
Calcium: 9.6 mg/dL (ref 8.4–10.5)
Chloride: 105 mEq/L (ref 96–112)
Creatinine, Ser: 1.03 mg/dL (ref 0.40–1.50)
GFR: 89.19 mL/min (ref >60.00)
Glucose, Bld: 94 mg/dL (ref 70–99)
Potassium: 4.1 mEq/L (ref 3.5–5.1)
Sodium: 140 mEq/L (ref 135–145)
Total Bilirubin: 0.7 mg/dL (ref 0.2–1.2)
Total Protein: 7.1 g/dL (ref 6.0–8.3)

## 2021-02-04 LAB — TSH: TSH: 0.95 u[IU]/mL (ref 0.35–4.50)

## 2021-02-04 LAB — PSA: PSA: 0.57 ng/mL (ref 0.10–4.00)

## 2021-02-04 MED ORDER — VITAMIN D3 50 MCG (2000 UT) PO CAPS: 2000.0000 [IU] | ORAL_CAPSULE | Freq: Every day | ORAL | 3 refills | Status: DC

## 2021-02-04 NOTE — Patient Instructions (Signed)

## 2021-02-04 NOTE — Assessment & Plan Note (Addendum)
  We discussed age appropriate health related issues, including available/recomended screening tests and vaccinations. Labs were ordered to be later reviewed . All questions were answered. We discussed one or more of the following - seat belt use, use of sunscreen/sun exposure exercise, safe sex, fall risk reduction, second hand smoke exposure, firearm use and storage, seat belt use, a need for adhering to healthy diet and exercise. Labs were ordered.  All questions were answered. Remote h/o XRT exposure in [place] - he will calculate the dose Restart Vit D Coronary calcium CT info was given

## 2021-02-04 NOTE — Progress Notes (Signed)
Subjective:  Patient ID: Adam Romero, male    DOB: [birth year]  Age: 45 y.o. MRN: [id number]  CC: Annual Exam   HPI Adam Romero presents for well exam  Outpatient Medications Prior to Visit  Medication Sig Dispense Refill  . Cholecalciferol (VITAMIN D3) 50 MCG (2000 UT) capsule Take 1 capsule (2,000 Units total) by mouth daily. (Patient not taking: Reported on 02/04/2021) 100 capsule 3   No facility-administered medications prior to visit.    ROS: Review of Systems  Constitutional: Negative for appetite change, fatigue and unexpected weight change.  HENT: Negative for congestion, nosebleeds, sneezing, sore throat and trouble swallowing.   Eyes: Negative for itching and visual disturbance.  Respiratory: Negative for cough.   Cardiovascular: Negative for chest pain, palpitations and leg swelling.  Gastrointestinal: Negative for abdominal distention, blood in stool, diarrhea and nausea.  Genitourinary: Negative for frequency and hematuria.  Musculoskeletal: Negative for back pain, gait problem, joint swelling and neck pain.  Skin: Negative for rash.  Neurological: Negative for dizziness, tremors, speech difficulty and weakness.  Psychiatric/Behavioral: Negative for agitation, dysphoric mood and sleep disturbance. The patient is not nervous/anxious.     Objective:  BP 120/72 (BP Location: Left Arm)   Pulse 75   Temp 98 F (36.7 C) (Oral)   Ht 5' 9.5" (1.765 m)   Wt 205 lb (93 kg)   SpO2 97%   BMI 29.84 kg/m   BP Readings from Last 3 Encounters:  02/04/21 120/72  12/12/19 124/72  12/06/18 124/82    Wt Readings from Last 3 Encounters:  02/04/21 205 lb (93 kg)  12/12/19 208 lb (94.3 kg)  12/06/18 214 lb (97.1 kg)    Physical Exam Constitutional:      General: He is not in acute distress.    Appearance: He is well-developed.     Comments: NAD  Eyes:     Conjunctiva/sclera: Conjunctivae normal.     Pupils: Pupils are equal, round, and reactive to light.   Neck:     Thyroid: No thyromegaly.     Vascular: No JVD.  Cardiovascular:     Rate and Rhythm: Normal rate and regular rhythm.     Heart sounds: Normal heart sounds. No murmur heard. No friction rub. No gallop.   Pulmonary:     Effort: Pulmonary effort is normal. No respiratory distress.     Breath sounds: Normal breath sounds. No wheezing or rales.  Chest:     Chest wall: No tenderness.  Abdominal:     General: Bowel sounds are normal. There is no distension.     Palpations: Abdomen is soft. There is no mass.     Tenderness: There is no abdominal tenderness. There is no guarding or rebound.  Musculoskeletal:        General: No tenderness. Normal range of motion.     Cervical back: Normal range of motion.  Lymphadenopathy:     Cervical: No cervical adenopathy.  Skin:    General: Skin is warm and dry.     Findings: No rash.  Neurological:     Mental Status: He is alert and oriented to person, place, and time.     Cranial Nerves: No cranial nerve deficit.     Motor: No abnormal muscle tone.     Coordination: Coordination normal.     Gait: Gait normal.     Deep Tendon Reflexes: Reflexes are normal and symmetric.  Psychiatric:        Behavior: Behavior normal.  Thought Content: Thought content normal.        Judgment: Judgment normal.   testes - self exam Moles are unchanged  Lab Results  Component Value Date   WBC 5.7 12/12/2019   HGB 15.5 12/12/2019   HCT 46.1 12/12/2019   PLT 197.0 12/12/2019   GLUCOSE 104 (H) 12/12/2019   CHOL 161 12/12/2019   TRIG 147.0 12/12/2019   HDL 34.40 (L) 12/12/2019   LDLCALC 97 12/12/2019   ALT 25 12/12/2019   AST 16 12/12/2019   NA 140 12/12/2019   K 4.1 12/12/2019   CL 106 12/12/2019   CREATININE 1.00 12/12/2019   BUN 18 12/12/2019   CO2 28 12/12/2019   TSH 1.03 12/12/2019   PSA 0.53 12/12/2019    Patient was never admitted.  Assessment & Plan:    Sonda Primes, MD

## 2021-11-22 ENCOUNTER — Encounter: Payer: Self-pay | Admitting: Internal Medicine

## 2021-12-01 ENCOUNTER — Telehealth: Payer: Managed Care, Other (non HMO) | Admitting: Internal Medicine

## 2022-02-08 ENCOUNTER — Encounter: Payer: Self-pay | Admitting: Internal Medicine

## 2022-02-08 ENCOUNTER — Ambulatory Visit (INDEPENDENT_AMBULATORY_CARE_PROVIDER_SITE_OTHER): Payer: Managed Care, Other (non HMO) | Admitting: Internal Medicine

## 2022-02-08 VITALS — BP 102/78 | HR 68 | Temp 98.5°F | Ht 69.5 in | Wt 207.0 lb

## 2022-02-08 DIAGNOSIS — K625 Hemorrhage of anus and rectum: Secondary | ICD-10-CM

## 2022-02-08 DIAGNOSIS — M79644 Pain in right finger(s): Secondary | ICD-10-CM

## 2022-02-08 DIAGNOSIS — Z136 Encounter for screening for cardiovascular disorders: Secondary | ICD-10-CM | POA: Diagnosis not present

## 2022-02-08 DIAGNOSIS — M722 Plantar fascial fibromatosis: Secondary | ICD-10-CM | POA: Diagnosis not present

## 2022-02-08 DIAGNOSIS — Z0000 Encounter for general adult medical examination without abnormal findings: Secondary | ICD-10-CM

## 2022-02-08 LAB — URINALYSIS
Bilirubin Urine: NEGATIVE
Hgb urine dipstick: NEGATIVE
Ketones, ur: NEGATIVE
Leukocytes,Ua: NEGATIVE
Nitrite: NEGATIVE
Specific Gravity, Urine: 1.02 (ref 1.000–1.030)
Total Protein, Urine: NEGATIVE
Urine Glucose: NEGATIVE
Urobilinogen, UA: 0.2 (ref 0.0–1.0)
pH: 7.5 (ref 5.0–8.0)

## 2022-02-08 LAB — COMPREHENSIVE METABOLIC PANEL
ALT: 22 U/L (ref 0–53)
AST: 16 U/L (ref 0–37)
Albumin: 4.4 g/dL (ref 3.5–5.2)
Alkaline Phosphatase: 73 U/L (ref 39–117)
BUN: 18 mg/dL (ref 6–23)
CO2: 29 mEq/L (ref 19–32)
Calcium: 9.4 mg/dL (ref 8.4–10.5)
Chloride: 104 mEq/L (ref 96–112)
Creatinine, Ser: 0.91 mg/dL (ref 0.40–1.50)
GFR: 102.75 mL/min (ref >60.00)
Glucose, Bld: 96 mg/dL (ref 70–99)
Potassium: 4 mEq/L (ref 3.5–5.1)
Sodium: 140 mEq/L (ref 135–145)
Total Bilirubin: 0.4 mg/dL (ref 0.2–1.2)
Total Protein: 7.5 g/dL (ref 6.0–8.3)

## 2022-02-08 LAB — CBC WITH DIFFERENTIAL/PLATELET
Basophils Absolute: 0 10*3/uL (ref 0.0–0.1)
Basophils Relative: 0.3 % (ref 0.0–3.0)
Eosinophils Absolute: 0.1 10*3/uL (ref 0.0–0.7)
Eosinophils Relative: 1.7 % (ref 0.0–5.0)
HCT: 43.8 % (ref 39.0–52.0)
Hemoglobin: 15 g/dL (ref 13.0–17.0)
Lymphocytes Relative: 40.8 % (ref 12.0–46.0)
Lymphs Abs: 2 10*3/uL (ref 0.7–4.0)
MCHC: 34.3 g/dL (ref 30.0–36.0)
MCV: 84.9 fl (ref 78.0–100.0)
Monocytes Absolute: 0.4 10*3/uL (ref 0.1–1.0)
Monocytes Relative: 7.9 % (ref 3.0–12.0)
Neutro Abs: 2.5 10*3/uL (ref 1.4–7.7)
Neutrophils Relative %: 49.3 % (ref 43.0–77.0)
Platelets: 185 10*3/uL (ref 150.0–400.0)
RBC: 5.16 Mil/uL (ref 4.22–5.81)
RDW: 12.6 % (ref 11.5–15.5)
WBC: 5 10*3/uL (ref 4.0–10.5)

## 2022-02-08 LAB — LIPID PANEL
Cholesterol: 148 mg/dL (ref 0–200)
HDL: 29.1 mg/dL — ABNORMAL LOW (ref >39.00)
LDL Cholesterol: 80 mg/dL (ref 0–99)
NonHDL: 118.93
Total CHOL/HDL Ratio: 5
Triglycerides: 193 mg/dL — ABNORMAL HIGH (ref 0.0–149.0)
VLDL: 38.6 mg/dL (ref 0.0–40.0)

## 2022-02-08 LAB — TSH: TSH: 0.98 u[IU]/mL (ref 0.35–5.50)

## 2022-02-08 MED ORDER — TRIAMCINOLONE ACETONIDE 0.1 % EX OINT: 1.0000 "application " | TOPICAL_OINTMENT | Freq: Two times a day (BID) | CUTANEOUS | 1 refills | Status: DC

## 2022-02-08 NOTE — Progress Notes (Addendum)
? ?Subjective:  ?Patient ID: Adam Romero, male    DOB: 01-26-1976  Age: 46 y.o. MRN: [id number] ? ?CC: No chief complaint on file. ? ? ?HPI ?Adam Romero presents for a well exam ? ?C/o L heel pain ?C/o R index finger pain and stiffness ?C/o BRBPR x 12 months ? ?Outpatient Medications Prior to Visit  ?Medication Sig Dispense Refill  ? Cholecalciferol (VITAMIN D3) 50 MCG (2000 UT) capsule Take 1 capsule (2,000 Units total) by mouth daily. 100 capsule 3  ? ?No facility-administered medications prior to visit.  ? ? ?ROS: ?Review of Systems  ?Constitutional:  Negative for appetite change, fatigue and unexpected weight change.  ?HENT:  Negative for congestion, nosebleeds, sneezing, sore throat and trouble swallowing.   ?Eyes:  Negative for itching and visual disturbance.  ?Respiratory:  Positive for cough.   ?Cardiovascular:  Negative for chest pain, palpitations and leg swelling.  ?Gastrointestinal:  Negative for abdominal distention, blood in stool, diarrhea and nausea.  ?Genitourinary:  Negative for frequency and hematuria.  ?Musculoskeletal:  Negative for back pain, gait problem, joint swelling and neck pain.  ?Skin:  Negative for rash.  ?Neurological:  Negative for dizziness, tremors, speech difficulty and weakness.  ?Psychiatric/Behavioral:  Negative for agitation, dysphoric mood and sleep disturbance. The patient is not nervous/anxious.   ? ?Objective:  ?BP 102/78 (BP Location: Left Arm, Patient Position: Sitting, Cuff Size: Large)   Pulse 68   Temp 98.5 ?F (36.9 ?C) (Oral)   Ht 5' 9.5" (1.765 m)   Wt 207 lb (93.9 kg)   SpO2 97%   BMI 30.13 kg/m?  ? ?BP Readings from Last 3 Encounters:  ?02/08/22 102/78  ?02/04/21 120/72  ?12/12/19 124/72  ? ? ?Wt Readings from Last 3 Encounters:  ?02/08/22 207 lb (93.9 kg)  ?02/04/21 205 lb (93 kg)  ?12/12/19 208 lb (94.3 kg)  ? ? ?Physical Exam ?Constitutional:   ?   General: He is not in acute distress. ?   Appearance: He is well-developed.  ?   Comments: NAD  ?Eyes:   ?   Conjunctiva/sclera: Conjunctivae normal.  ?   Pupils: Pupils are equal, round, and reactive to light.  ?Neck:  ?   Thyroid: No thyromegaly.  ?   Vascular: No JVD.  ?Cardiovascular:  ?   Rate and Rhythm: Normal rate and regular rhythm.  ?   Heart sounds: Normal heart sounds. No murmur heard. ?  No friction rub. No gallop.  ?Pulmonary:  ?   Effort: Pulmonary effort is normal. No respiratory distress.  ?   Breath sounds: Normal breath sounds. No wheezing or rales.  ?Chest:  ?   Chest wall: No tenderness.  ?Abdominal:  ?   General: Bowel sounds are normal. There is no distension.  ?   Palpations: Abdomen is soft. There is no mass.  ?   Tenderness: There is no abdominal tenderness. There is no guarding or rebound.  ?Musculoskeletal:     ?   General: No tenderness. Normal range of motion.  ?   Cervical back: Normal range of motion.  ?Lymphadenopathy:  ?   Cervical: No cervical adenopathy.  ?Skin: ?   General: Skin is warm and dry.  ?   Findings: No rash.  ?Neurological:  ?   Mental Status: He is alert and oriented to person, place, and time.  ?   Cranial Nerves: No cranial nerve deficit.  ?   Motor: No abnormal muscle tone.  ?   Coordination: Coordination normal.  ?  Gait: Gait normal.  ?   Deep Tendon Reflexes: Reflexes are normal and symmetric.  ?Psychiatric:     ?   Behavior: Behavior normal.     ?   Thought Content: Thought content normal.     ?   Judgment: Judgment normal.  ?R index PIP w/pain ?L heel w/pain ? ?Pt declined rectal exam ? ?I spent 22 minutes in addition to time for CPX wellness examination in preparing to see the patient by review of recent labs, imaging and procedures, obtaining and reviewing separately obtained history, communicating with the patient, ordering medications, tests or procedures, and documenting clinical information in the EHR including the differential diagnosis, treatment, and any further evaluation and other management of BRBPR, heel pain, finger pain.  ?  ?  ? ? ? ? ?Lab  Results  ?Component Value Date  ? WBC 6.2 02/04/2021  ? HGB 15.3 02/04/2021  ? HCT 45.8 02/04/2021  ? PLT 184.0 02/04/2021  ? GLUCOSE 94 02/04/2021  ? CHOL 159 02/04/2021  ? TRIG 81.0 02/04/2021  ? HDL 38.10 (L) 02/04/2021  ? LDLCALC 104 (H) 02/04/2021  ? ALT 30 02/04/2021  ? AST 21 02/04/2021  ? NA 140 02/04/2021  ? K 4.1 02/04/2021  ? CL 105 02/04/2021  ? CREATININE 1.03 02/04/2021  ? BUN 20 02/04/2021  ? CO2 29 02/04/2021  ? TSH 0.95 02/04/2021  ? PSA 0.57 02/04/2021  ? ? ?Patient was never admitted. ? ?Assessment & Plan:  ? ?Problem List Items Addressed This Visit   ? ? Well adult exam - Primary  ?   ?We discussed age appropriate health related issues, including available/recomended screening tests and vaccinations. Labs were ordered to be later reviewed . All questions were answered. We discussed one or more of the following - seat belt use, use of sunscreen/sun exposure exercise, fall risk reduction, second hand smoke exposure, firearm use and storage, seat belt use, a need for adhering to healthy diet and exercise. ?Labs were ordered.  All questions were answered. ? ? ?  ?  ? Relevant Orders  ? TSH  ? Urinalysis  ? CBC with Differential/Platelet  ? Lipid panel  ? Comprehensive metabolic panel  ? Finger pain, right  ?  Blue-Emu cream was recommended to use 2-3 times a day ?Finger splint ?  ?  ? Plantar fasciitis  ?  New ?[place], [employer] ?Spenco inserts ?Blue-Emu cream was recommended to use 2-3 times a day ?  ?  ? BRBPR (bright red blood per rectum)  ?  New x 12 months ?Triamc oint ?GI ref - Dr Barron Alvine ?Pt declined rectal exam ?Fam h/o GI cancers ?  ?  ? Relevant Orders  ? Ambulatory referral to Gastroenterology  ?  ? ? ?Meds ordered this encounter  ?Medications  ? triamcinolone ointment (KENALOG) 0.1 %  ?  Sig: Apply 1 application. topically 2 (two) times daily.  ?  Dispense:  80 g  ?  Refill:  1  ?  ? ? ?Follow-up: Return in about 1 year (around 02/09/2023) for a follow-up visit. ? ?Sonda Primes, MD ?

## 2022-02-08 NOTE — Assessment & Plan Note (Addendum)
New x 12 months ?Triamc oint ?GI ref - Dr Barron Alvine ?Pt declined rectal exam ?Fam h/o GI cancers ?

## 2022-02-08 NOTE — Assessment & Plan Note (Signed)
Blue-Emu cream was recommended to use 2-3 times a day ?Finger splint ?

## 2022-02-08 NOTE — Assessment & Plan Note (Signed)

## 2022-02-08 NOTE — Assessment & Plan Note (Addendum)
New ?[place], [employer] ?Spenco inserts ?Blue-Emu cream was recommended to use 2-3 times a day ?

## 2022-02-08 NOTE — Patient Instructions (Addendum)
[place] ? ?Blue-Emu cream was recommended to use 2-3 times a day ? ?Finger splint ?

## 2023-02-16 ENCOUNTER — Ambulatory Visit: Payer: Managed Care, Other (non HMO) | Admitting: Internal Medicine

## 2023-02-16 ENCOUNTER — Encounter: Payer: Self-pay | Admitting: Internal Medicine

## 2023-02-16 VITALS — BP 122/68 | HR 78 | Temp 98.0°F | Ht 69.5 in | Wt 205.0 lb

## 2023-02-16 DIAGNOSIS — Z0000 Encounter for general adult medical examination without abnormal findings: Secondary | ICD-10-CM

## 2023-02-16 DIAGNOSIS — Z1211 Encounter for screening for malignant neoplasm of colon: Secondary | ICD-10-CM

## 2023-02-16 LAB — LIPID PANEL
Cholesterol: 118 mg/dL (ref 0–200)
HDL: 34.1 mg/dL — ABNORMAL LOW (ref >39.00)
LDL Cholesterol: 72 mg/dL (ref 0–99)
NonHDL: 83.95
Total CHOL/HDL Ratio: 3
Triglycerides: 62 mg/dL (ref 0.0–149.0)
VLDL: 12.4 mg/dL (ref 0.0–40.0)

## 2023-02-16 LAB — CBC WITH DIFFERENTIAL/PLATELET
Basophils Absolute: 0 10*3/uL (ref 0.0–0.1)
Basophils Relative: 0.2 % (ref 0.0–3.0)
Eosinophils Absolute: 0 10*3/uL (ref 0.0–0.7)
Eosinophils Relative: 0.9 % (ref 0.0–5.0)
HCT: 46.1 % (ref 39.0–52.0)
Hemoglobin: 15.5 g/dL (ref 13.0–17.0)
Lymphocytes Relative: 38.2 % (ref 12.0–46.0)
Lymphs Abs: 1.7 10*3/uL (ref 0.7–4.0)
MCHC: 33.6 g/dL (ref 30.0–36.0)
MCV: 85.7 fl (ref 78.0–100.0)
Monocytes Absolute: 0.8 10*3/uL (ref 0.1–1.0)
Monocytes Relative: 16.8 % — ABNORMAL HIGH (ref 3.0–12.0)
Neutro Abs: 2 10*3/uL (ref 1.4–7.7)
Neutrophils Relative %: 43.9 % (ref 43.0–77.0)
Platelets: 181 10*3/uL (ref 150.0–400.0)
RBC: 5.38 Mil/uL (ref 4.22–5.81)
RDW: 12.9 % (ref 11.5–15.5)
WBC: 4.5 10*3/uL (ref 4.0–10.5)

## 2023-02-16 LAB — COMPREHENSIVE METABOLIC PANEL
ALT: 35 U/L (ref 0–53)
AST: 23 U/L (ref 0–37)
Albumin: 4.3 g/dL (ref 3.5–5.2)
Alkaline Phosphatase: 69 U/L (ref 39–117)
BUN: 18 mg/dL (ref 6–23)
CO2: 27 mEq/L (ref 19–32)
Calcium: 9.3 mg/dL (ref 8.4–10.5)
Chloride: 104 mEq/L (ref 96–112)
Creatinine, Ser: 1.01 mg/dL (ref 0.40–1.50)
GFR: 90.02 mL/min (ref >60.00)
Glucose, Bld: 90 mg/dL (ref 70–99)
Potassium: 3.5 mEq/L (ref 3.5–5.1)
Sodium: 138 mEq/L (ref 135–145)
Total Bilirubin: 0.6 mg/dL (ref 0.2–1.2)
Total Protein: 7.3 g/dL (ref 6.0–8.3)

## 2023-02-16 LAB — URINALYSIS, ROUTINE W REFLEX MICROSCOPIC
Ketones, ur: 15 — AB
Leukocytes,Ua: NEGATIVE
Nitrite: NEGATIVE
Specific Gravity, Urine: >=1.03 — AB (ref 1.000–1.030)
Urine Glucose: NEGATIVE
Urobilinogen, UA: 0.2 (ref 0.0–1.0)
pH: 6 (ref 5.0–8.0)

## 2023-02-16 LAB — TSH: TSH: 1.29 u[IU]/mL (ref 0.35–5.50)

## 2023-02-16 NOTE — Assessment & Plan Note (Signed)
We discussed age appropriate health related issues, including available/recomended screening tests and vaccinations. Labs were ordered to be later reviewed . All questions were answered. We discussed one or more of the following - seat belt use, use of sunscreen/sun exposure exercise, safe sex, fall risk reduction, second hand smoke exposure, firearm use and storage, seat belt use, a need for adhering to healthy diet and exercise. Labs were ordered.  All questions were answered. Remote h/o XRT exposure in [place] at 47 yo - he will calculate the dose Coronary calcium CT info was given 4/22

## 2023-02-16 NOTE — Progress Notes (Signed)
Subjective:  Patient ID: Adam Romero, male    DOB: 09-Mar-1976  Age: 47 y.o. MRN: [id number]  CC: Annual Exam   HPI Adam Romero presents for a well exam   Outpatient Medications Prior to Visit  Medication Sig Dispense Refill   Cholecalciferol (VITAMIN D3) 50 MCG (2000 UT) capsule Take 1 capsule (2,000 Units total) by mouth daily. 100 capsule 3   triamcinolone ointment (KENALOG) 0.1 % Apply 1 application. topically 2 (two) times daily. 80 g 1   No facility-administered medications prior to visit.    ROS: Review of Systems  Constitutional:  Negative for appetite change, fatigue and unexpected weight change.  HENT:  Negative for congestion, nosebleeds, sneezing, sore throat and trouble swallowing.   Eyes:  Negative for itching and visual disturbance.  Respiratory:  Negative for cough.   Cardiovascular:  Negative for chest pain, palpitations and leg swelling.  Gastrointestinal:  Negative for abdominal distention, blood in stool, diarrhea and nausea.  Genitourinary:  Negative for frequency and hematuria.  Musculoskeletal:  Negative for back pain, gait problem, joint swelling and neck pain.  Skin:  Negative for rash.  Neurological:  Negative for dizziness, tremors, speech difficulty and weakness.  Psychiatric/Behavioral:  Negative for agitation, dysphoric mood and sleep disturbance. The patient is not nervous/anxious.     Objective:  BP 122/68   Pulse 78   Temp 98 F (36.7 C) (Oral)   Ht 5' 9.5" (1.765 m)   Wt 205 lb (93 kg)   SpO2 97%   BMI 29.84 kg/m   BP Readings from Last 3 Encounters:  02/16/23 122/68  02/08/22 102/78  02/04/21 120/72    Wt Readings from Last 3 Encounters:  02/16/23 205 lb (93 kg)  02/08/22 207 lb (93.9 kg)  02/04/21 205 lb (93 kg)    Physical Exam Constitutional:      General: He is not in acute distress.    Appearance: He is well-developed. He is obese.     Comments: NAD  Eyes:     Conjunctiva/sclera: Conjunctivae normal.      Pupils: Pupils are equal, round, and reactive to light.  Neck:     Thyroid: No thyromegaly.     Vascular: No JVD.  Cardiovascular:     Rate and Rhythm: Normal rate and regular rhythm.     Heart sounds: Normal heart sounds. No murmur heard.    No friction rub. No gallop.  Pulmonary:     Effort: Pulmonary effort is normal. No respiratory distress.     Breath sounds: Normal breath sounds. No wheezing or rales.  Chest:     Chest wall: No tenderness.  Abdominal:     General: Bowel sounds are normal. There is no distension.     Palpations: Abdomen is soft. There is no mass.     Tenderness: There is no abdominal tenderness. There is no guarding or rebound.  Musculoskeletal:        General: No tenderness. Normal range of motion.     Cervical back: Normal range of motion.  Lymphadenopathy:     Cervical: No cervical adenopathy.  Skin:    General: Skin is warm and dry.     Findings: No rash.  Neurological:     Mental Status: He is alert and oriented to person, place, and time.     Cranial Nerves: No cranial nerve deficit.     Motor: No abnormal muscle tone.     Coordination: Coordination normal.     Gait: Gait normal.  Deep Tendon Reflexes: Reflexes are normal and symmetric.  Psychiatric:        Behavior: Behavior normal.        Thought Content: Thought content normal.        Judgment: Judgment normal.   Rectal - anal tag, small  Lab Results  Component Value Date   WBC 5.0 02/08/2022   HGB 15.0 02/08/2022   HCT 43.8 02/08/2022   PLT 185.0 02/08/2022   GLUCOSE 96 02/08/2022   CHOL 148 02/08/2022   TRIG 193.0 (H) 02/08/2022   HDL 29.10 (L) 02/08/2022   LDLCALC 80 02/08/2022   ALT 22 02/08/2022   AST 16 02/08/2022   NA 140 02/08/2022   K 4.0 02/08/2022   CL 104 02/08/2022   CREATININE 0.91 02/08/2022   BUN 18 02/08/2022   CO2 29 02/08/2022   TSH 0.98 02/08/2022   PSA 0.57 02/04/2021    Patient was never admitted.  Assessment & Plan:   Problem List Items  Addressed This Visit       Other   Well adult exam - Primary    We discussed age appropriate health related issues, including available/recomended screening tests and vaccinations. Labs were ordered to be later reviewed . All questions were answered. We discussed one or more of the following - seat belt use, use of sunscreen/sun exposure exercise, safe sex, fall risk reduction, second hand smoke exposure, firearm use and storage, seat belt use, a need for adhering to healthy diet and exercise. Labs were ordered.  All questions were answered. Remote h/o XRT exposure in [place] at 47 yo - he will calculate the dose Coronary calcium CT info was given 4/22      Relevant Orders   TSH   Urinalysis   CBC with Differential/Platelet   Lipid panel   Comprehensive metabolic panel   Other Visit Diagnoses     Screening for colon cancer       Relevant Orders   Ambulatory referral to Gastroenterology         No orders of the defined types were placed in this encounter.     Follow-up: Return in about 1 year (around 02/16/2024) for Wellness Exam.  Sonda Primes, MD

## 2023-03-03 ENCOUNTER — Encounter: Payer: Self-pay | Admitting: Gastroenterology

## 2023-04-28 ENCOUNTER — Telehealth (INDEPENDENT_AMBULATORY_CARE_PROVIDER_SITE_OTHER): Payer: Managed Care, Other (non HMO) | Admitting: Family Medicine

## 2023-04-28 ENCOUNTER — Encounter: Payer: Self-pay | Admitting: Family Medicine

## 2023-04-28 DIAGNOSIS — W57XXXA Bitten or stung by nonvenomous insect and other nonvenomous arthropods, initial encounter: Secondary | ICD-10-CM

## 2023-04-28 DIAGNOSIS — S70262A Insect bite (nonvenomous), left hip, initial encounter: Secondary | ICD-10-CM | POA: Diagnosis not present

## 2023-04-28 MED ORDER — DOXYCYCLINE HYCLATE 100 MG PO TABS
200.0000 mg | ORAL_TABLET | Freq: Once | ORAL | 0 refills | Status: AC
Start: 2023-04-28 — End: 2023-04-28

## 2023-04-28 NOTE — Progress Notes (Signed)
MyChart Video Visit    Virtual Visit via Video Note    Patient location: Home. Patient and provider in visit Provider location: Office  I discussed the limitations of evaluation and management by telemedicine and the availability of in person appointments. The patient expressed understanding and agreed to proceed.  Visit Date: 04/28/2023  Today's healthcare provider: Hetty Blend, NP-C     Subjective:    Patient ID: Adam Romero, male    DOB: 11-Jan-1976, 47 y.o.   MRN: [id number]  Chief Complaint  Patient presents with   Insect Bite    HPI  Tick found on his hip yesterday. Possibly present 24-48 hours. Unclear if tick engorged but it seemed large to patient.  Red rash around the bite. No pain or itching.  .  Past Medical History:  Diagnosis Date   Kidney stone 2007   R    History reviewed. No pertinent surgical history.  Family History  Problem Relation Age of Onset   Stomach cancer Mother     Social History   Socioeconomic History   Marital status: Married    Spouse name: Not on file   Number of children: Not on file   Years of education: Not on file   Highest education level: Not on file  Occupational History   Not on file  Tobacco Use   Smoking status: Former   Smokeless tobacco: Never  Substance and Sexual Activity   Alcohol use: Yes   Drug use: No   Sexual activity: Yes  Other Topics Concern   Not on file  Social History Narrative   Not on file   Social Determinants of Health   Financial Resource Strain: Not on file  Food Insecurity: Not on file  Transportation Needs: Not on file  Physical Activity: Not on file  Stress: Not on file  Social Connections: Not on file  Intimate Partner Violence: Not on file    Outpatient Medications Prior to Visit  Medication Sig Dispense Refill   Cholecalciferol (VITAMIN D3) 50 MCG (2000 UT) capsule Take 1 capsule (2,000 Units total) by mouth daily. 100 capsule 3   triamcinolone ointment  (KENALOG) 0.1 % Apply 1 application. topically 2 (two) times daily. 80 g 1   No facility-administered medications prior to visit.    No Known Allergies  Review of Systems  Constitutional:  Negative for chills and fever.  Gastrointestinal:  Negative for abdominal pain, diarrhea, nausea and vomiting.  Musculoskeletal:  Negative for joint pain, myalgias and neck pain.  Neurological:  Negative for dizziness and headaches.       Objective:    Physical Exam Constitutional:      General: He is not in acute distress.    Appearance: He is not ill-appearing.  Pulmonary:     Effort: Pulmonary effort is normal.  Musculoskeletal:     Cervical back: Normal range of motion.  Skin:    Comments: Approx 1 cm red area surrounding a dark central area consistent with a bite.  Neurological:     General: No focal deficit present.     Mental Status: He is alert and oriented to person, place, and time.  Psychiatric:        Mood and Affect: Mood normal.        Behavior: Behavior normal.        Thought Content: Thought content normal.     There were no vitals taken for this visit. Wt Readings from Last 3 Encounters:  02/16/23 205  lb (93 kg)  02/08/22 207 lb (93.9 kg)  02/04/21 205 lb (93 kg)        Assessment & Plan:   Problem List Items Addressed This Visit   None Visit Diagnoses     Tick bite of left hip, initial encounter    -  Primary   Relevant Medications   doxycycline (VIBRA-TABS) 100 MG tablet      Treat with Doxycycline per CDC guidelines. Discussed red flag symptoms.  Avoid tick bites in future.   I am having Adam Romero start on doxycycline. I am also having him maintain his Vitamin D3 and triamcinolone ointment.  Meds ordered this encounter  Medications   doxycycline (VIBRA-TABS) 100 MG tablet    Sig: Take 2 tablets (200 mg total) by mouth once for 1 dose.    Dispense:  2 tablet    Refill:  0    Order Specific Question:   Supervising Provider    Answer:    Hillard Danker A [[id number]]    I discussed the assessment and treatment plan with the patient. The patient was provided an opportunity to ask questions and all were answered. The patient agreed with the plan and demonstrated an understanding of the instructions.   The patient was advised to call back or seek an in-person evaluation if the symptoms worsen or if the condition fails to improve as anticipated.  I provided 15 minutes of face-to-face time during this encounter.   Hetty Blend, NP-C [facility] [phone] (phone) [phone] (fax)  [facility]

## 2023-04-28 NOTE — Patient Instructions (Signed)
Take the one dose of Doxycycline.   Monitor for fever (>101.4), severe headache, pain in your joints, and a large target type rash around the bite or any concerning new symptoms and let us know.   Tick Bite Information, Adult  Ticks are insects that can bite. Most ticks live in bushes and grassy areas. They climb onto people and animals that go by. Then they bite. Some ticks carry germs that can make you sick. How can I prevent tick bites? Take these steps: Before you go outside: Wear long sleeves and long pants. Wear light-colored clothes. Tuck your pant legs into your socks. Use an insect repellent that has 20% or higher of the ingredients DEET, picaridin, or IR3535. Follow the instructions on the label. Put it on: Bare skin. Avoid your eyes and mouth areas. The tops of your boots. Your pant legs. The ends of your sleeves. If you use an insect repellent that has the ingredient permethrin, follow the instructions on the label. Do not put permethrin on the skin. Put it on: Clothing. Shoes. Outdoor gear. Tents. When you are outside Avoid walking through long grass. Stay in the middle of the trail. Do not touch the bushes. Check for ticks on your clothes, hair, and skin often while you are outside. Check again before you go inside. When you go indoors Check your clothes for ticks. Dry your clothes in a dryer on high heat for 10 minutes or more. If clothes are damp, more time may be needed. Wash your clothes right away if they need to be washed. Use hot water. Check your pets and outdoor gear. Shower right away. Check your body for ticks. Do a full body check using a mirror. Check your clothes, skin, head, neck, armpits, waist, groin, and joint areas. What is the right way to remove a tick? Remove the tick from your skin as soon as possible. Do not remove the tick with your bare fingers. Do not try to remove a tick with heat, alcohol, petroleum jelly, or fingernail polish. To remove a  tick that is crawling on your skin: Go outside and brush the tick off. Use tape or a lint roller. To remove a tick that is biting: Wash your hands. If you have gloves, put them on. Use tweezers, curved forceps, or a tick-removal tool to grasp the tick. Grasp the tick as close to your skin and as close to the tick's head as possible. Gently pull up until the tick lets go. Try to keep the tick's head attached to its body. Do not twist or jerk the tick. Do not squeeze or crush the tick. What should I do after taking out a tick? Clean the bite area and your hands with soap and water, rubbing alcohol, or an iodine wash. If you have an antiseptic cream or ointment, put a small amount on the bite area. Wash and disinfect any instruments that you used to remove the tick. How should I get rid of a live tick? To get rid of a live tick, use one of these methods: Place the tick in rubbing alcohol. Place it in a bag or container you can close tightly. Throw it away. Wrap it tightly in tape. Throw it away. Flush it down the toilet. Where to find more information Centers for Disease Control and Prevention: GyrateAtrophy.si U.S. Environmental Protection Agency: RelocationNetworking.fi Contact a doctor if: You have a fever or chills. You have a red rash that makes a circle (bull's-eye rash) in the bite  area. You have redness and swelling where the tick bit you. You have a headache or stiff neck. You have pain in a muscle, joint, or bone. You are more tired than normal. You have trouble walking or moving your legs. You have numbness in your legs. You have tender or swollen lymph glands. You have belly (abdominal) pain, vomiting, watery poop (diarrhea), or weight loss. Get help right away if: You cannot remove a tick. You cannot move (have paralysis) or feel weak. You are feeling worse or have new symptoms. You find a tick that is biting you and filled with blood, especially if you are in an area  where diseases from ticks are common. Summary Ticks may carry germs that can make you sick. To prevent tick bites wear long sleeves, long pants, and light colors. Use insect repellent. Follow the instructions on the label. If the tick is biting, remove it right away. Do not try to remove it with heat, alcohol, petroleum jelly, or fingernail polish. Contact a doctor if you have symptoms of a disease after being bitten by a tick. This information is not intended to replace advice given to you by your health care provider. Make sure you discuss any questions you have with your health care provider. Document Revised: 01/17/2022 Document Reviewed: 01/17/2022 Elsevier Patient Education  2024 [employer].

## 2023-07-21 ENCOUNTER — Ambulatory Visit: Payer: Managed Care, Other (non HMO) | Admitting: Gastroenterology

## 2023-08-16 ENCOUNTER — Encounter: Payer: Self-pay | Admitting: Internal Medicine

## 2023-08-23 ENCOUNTER — Other Ambulatory Visit: Payer: Self-pay | Admitting: Internal Medicine

## 2023-08-23 DIAGNOSIS — M25569 Pain in unspecified knee: Secondary | ICD-10-CM

## 2023-08-31 ENCOUNTER — Other Ambulatory Visit: Payer: Self-pay

## 2023-08-31 ENCOUNTER — Ambulatory Visit: Payer: Managed Care, Other (non HMO) | Admitting: Family Medicine

## 2023-08-31 VITALS — BP 132/84 | HR 94 | Ht 69.5 in | Wt 215.0 lb

## 2023-08-31 DIAGNOSIS — G8929 Other chronic pain: Secondary | ICD-10-CM | POA: Diagnosis not present

## 2023-08-31 DIAGNOSIS — M25562 Pain in left knee: Secondary | ICD-10-CM

## 2023-08-31 NOTE — Patient Instructions (Addendum)
Thank you for coming in today.   Please use Voltaren gel (Generic Diclofenac Gel) up to 4x daily for pain as needed.  This is available over-the-counter as both the name brand Voltaren gel and the generic diclofenac gel.   Next step is physical therapy if needed.  I can add that with a phone call or a MyChart message.   Ok to work on [employer].   The [place] L Protocol for Hamstring Strains  Leeroy Cha PT   If needed we could xray and eventually MRI.

## 2023-08-31 NOTE — Progress Notes (Signed)
   Rubin Payor, PhD, LAT, ATC acting as a scribe for Clementeen Graham, MD.  Adam Romero is a 47 y.o. male who presents to [facility] today for L knee pain ongoing for a couple months, worsening over the month. Pain has improved somewhat. Pt locates pain to the medial aspect of his L knee.  As noted above his pain is a lot better now than it was about a month ago.  L Knee swelling: no Mechanical symptoms: no Aggravates: deep squatting Treatments tried: none  Pertinent review of systems: No fevers or chills  Relevant historical information: Plantar fasciitis.  Patient works as a Lawyer.   Exam:  BP 132/84   Pulse 94   Ht 5' 9.5" (1.765 m)   Wt 215 lb (97.5 kg)   SpO2 97%   BMI 31.29 kg/m  General: Well Developed, well nourished, and in no acute distress.   MSK: Left knee: Normal-appearing Normal motion. Mildly tender palpation posterior medial distal hamstring. Stable ligamentous exam. Intact strength. No pain with resisted knee flexion.    Lab and Radiology Results  Diagnostic Limited MSK Ultrasound of: Left knee Quad tendon intact normal.  No joint effusion. Patellar tendon is normal. Medial joint line normal appearing with no degeneration of the medial meniscus. Lateral joint line normal appearing. Posterior medial knee very slight hypoechoic fluid near the posterior medial hamstring tendon.  No Baker's cyst. Impression: Hamstring tendinitis      Assessment and Plan: 48 y.o. male with left knee pain.  He is feeling a lot better now than he was when he first requested this visit.  Plan for a bit of watchful waiting and Voltaren gel.  Recommend home hamstring exercise program.  Reviewed the [place] L protocol.  If needed we can add formal physical therapy and get an x-ray.   PDMP not reviewed this encounter. Orders Placed This Encounter  Procedures   [place] LIMITED JOINT SPACE STRUCTURES LOW LEFT(NO LINKED CHARGES)     Order Specific Question:   Reason for Exam (SYMPTOM  OR DIAGNOSIS REQUIRED)    Answer:   left knee pain    Order Specific Question:   Preferred imaging location?    Answer:   [facility]   No orders of the defined types were placed in this encounter.    Discussed warning signs or symptoms. Please see discharge instructions. Patient expresses understanding.   The above documentation has been reviewed and is accurate and complete Clementeen Graham, M.D.

## 2023-09-08 ENCOUNTER — Ambulatory Visit (INDEPENDENT_AMBULATORY_CARE_PROVIDER_SITE_OTHER): Payer: Managed Care, Other (non HMO) | Admitting: Physician Assistant

## 2023-09-08 ENCOUNTER — Encounter: Payer: Self-pay | Admitting: Physician Assistant

## 2023-09-08 VITALS — BP 116/84 | HR 84 | Ht 70.0 in | Wt 213.4 lb

## 2023-09-08 DIAGNOSIS — K625 Hemorrhage of anus and rectum: Secondary | ICD-10-CM

## 2023-09-08 DIAGNOSIS — Z1211 Encounter for screening for malignant neoplasm of colon: Secondary | ICD-10-CM | POA: Diagnosis not present

## 2023-09-08 MED ORDER — NA SULFATE-K SULFATE-MG SULF 17.5-3.13-1.6 GM/177ML PO SOLN: 1.0000 | Freq: Once | ORAL | 0 refills | Status: AC

## 2023-09-08 NOTE — Patient Instructions (Signed)
You have been scheduled for a colonoscopy. Please follow written instructions given to you at your visit today.   Please pick up your prep supplies at the pharmacy within the next 1-3 days.  If you use inhalers (even only as needed), please bring them with you on the day of your procedure.  DO NOT TAKE 7 DAYS PRIOR TO TEST- Trulicity (dulaglutide) Ozempic, Wegovy (semaglutide) Mounjaro (tirzepatide) Bydureon Bcise (exanatide extended release)  DO NOT TAKE 1 DAY PRIOR TO YOUR TEST Rybelsus (semaglutide) Adlyxin (lixisenatide) Victoza (liraglutide) Byetta (exanatide)  _______________________________________________________  If your blood pressure at your visit was 140/90 or greater, please contact your primary care physician to follow up on this.  _______________________________________________________  If you are age 47 or older, your body mass index should be between 23-30. Your Body mass index is 30.62 kg/m. If this is out of the aforementioned range listed, please consider follow up with your Primary Care Provider.  If you are age 42 or younger, your body mass index should be between 19-25. Your Body mass index is 30.62 kg/m. If this is out of the aformentioned range listed, please consider follow up with your Primary Care Provider.   ________________________________________________________  The [facility] providers would like to encourage you to use [facility] to communicate with providers for non-urgent requests or questions.  Due to long hold times on the telephone, sending your provider a message by [facility] may be a faster and more efficient way to get a response.  Please allow 48 business hours for a response.  Please remember that this is for non-urgent requests.   It was a pleasure to see you today!  Thank you for trusting me with your gastrointestinal care!    Hyacinth Meeker, PA-C  ___________________________________________________________________________

## 2023-09-08 NOTE — Progress Notes (Signed)
Chief Complaint: Rectal Bleeding, Constipation  HPI:      Adam Romero, is a 47 y/o male with a past medical history as listed below, who was referred to me by Plotnikov, Georgina Quint, MD for a complaint of Rectal Bleeding and constipation .    02/16/2023 CBC normal.  CMP normal.    Today, patient presents to clinic and tells me he has had some bright red blood on the toilet paper and/or in his stool off and on for a few years now.  He has always thought this is related to hemorrhoids or maybe a tear back there as occasionally he will have sharp pain when passing a larger than normal stool.  Currently not experiencing much other than some dull discomfort but has not seen any blood lately.  No prior colonoscopy.    Denies fever, chills or weight loss.  Past Medical History:  Diagnosis Date   Kidney stone 2007   R    No past surgical history on file.  Current Outpatient Medications  Medication Sig Dispense Refill   Cholecalciferol (VITAMIN D3) 50 MCG (2000 UT) capsule Take 1 capsule (2,000 Units total) by mouth daily. (Patient not taking: Reported on 09/08/2023) 100 capsule 3   triamcinolone ointment (KENALOG) 0.1 % Apply 1 application. topically 2 (two) times daily. (Patient not taking: Reported on 09/08/2023) 80 g 1   No current facility-administered medications for this visit.    Allergies as of 09/08/2023   (No Known Allergies)    Family History  Problem Relation Age of Onset   Stomach cancer Mother     Social History   Socioeconomic History   Marital status: Married    Spouse name: Not on file   Number of children: Not on file   Years of education: Not on file   Highest education level: Not on file  Occupational History   Not on file  Tobacco Use   Smoking status: Former   Smokeless tobacco: Never  Substance and Sexual Activity   Alcohol use: Yes   Drug use: No   Sexual activity: Yes  Other Topics Concern   Not on file  Social History Narrative   Not on file    Social Determinants of Health   Financial Resource Strain: Not on file  Food Insecurity: Not on file  Transportation Needs: Not on file  Physical Activity: Not on file  Stress: Not on file  Social Connections: Not on file  Intimate Partner Violence: Not on file    Review of Systems:    Constitutional: No weight loss, fever or chills Skin: No rash  Cardiovascular: No chest pain Respiratory: No SOB Gastrointestinal: See HPI and otherwise negative Genitourinary: No dysuria  Neurological: No headache, dizziness or syncope Musculoskeletal: No new muscle or joint pain Hematologic: No bruising Psychiatric: No history of depression or anxiety   Physical Exam:  Vital signs: BP 116/84   Pulse 84   Ht 5\' 10"  (1.778 m)   Wt 213 lb 6 oz (96.8 kg)   BMI 30.62 kg/m    Constitutional:   Pleasant male appears to be in NAD, Well developed, Well nourished, alert and cooperative Head:  Normocephalic and atraumatic. Eyes:   PEERL, EOMI. No icterus. Conjunctiva pink. Ears:  Normal auditory acuity. Neck:  Supple Throat: Oral cavity and pharynx without inflammation, swelling or lesion.  Respiratory: Respirations even and unlabored. Lungs clear to auscultation bilaterally.   No wheezes, crackles, or rhonchi.  Cardiovascular: Normal S1, S2. No MRG.  Regular rate and rhythm. No peripheral edema, cyanosis or pallor.  Gastrointestinal:  Soft, nondistended, nontender. No rebound or guarding. Normal bowel sounds. No appreciable masses or hepatomegaly. Rectal: External: Superficial tear just proximal to the rectum posteriorly which does bleed when touched, very small, some visible small external hemorrhoids and 1 external hemorrhoid tag; internal: No tenderness or residue, no mass; anoscopy: Not done today as patient is going to have a colonoscopy soon and did not want to aggravate superficial tear Msk:  Symmetrical without gross deformities. Without edema, no deformity or joint abnormality.   Neurologic:  Alert and  oriented x4;  grossly normal neurologically.  Skin:   Dry and intact without significant lesions or rashes. Psychiatric: Demonstrates good judgement and reason without abnormal affect or behaviors.  RELEVANT LABS AND IMAGING: CBC    Component Value Date/Time   WBC 4.5 02/16/2023 1616   RBC 5.38 02/16/2023 1616   HGB 15.5 02/16/2023 1616   HCT 46.1 02/16/2023 1616   PLT 181.0 02/16/2023 1616   MCV 85.7 02/16/2023 1616   MCHC 33.6 02/16/2023 1616   RDW 12.9 02/16/2023 1616   LYMPHSABS 1.7 02/16/2023 1616   MONOABS 0.8 02/16/2023 1616   EOSABS 0.0 02/16/2023 1616   BASOSABS 0.0 02/16/2023 1616    CMP     Component Value Date/Time   NA 138 02/16/2023 1616   K 3.5 02/16/2023 1616   CL 104 02/16/2023 1616   CO2 27 02/16/2023 1616   GLUCOSE 90 02/16/2023 1616   BUN 18 02/16/2023 1616   CREATININE 1.01 02/16/2023 1616   CALCIUM 9.3 02/16/2023 1616   PROT 7.3 02/16/2023 1616   ALBUMIN 4.3 02/16/2023 1616   AST 23 02/16/2023 1616   ALT 35 02/16/2023 1616   ALKPHOS 69 02/16/2023 1616   BILITOT 0.6 02/16/2023 1616    Assessment: 1.  Rectal bleeding: Off-and-on for a few years, likely related to either external excoriation and or internal hemorrhoids, but no prior colonoscopy; consider hemorrhoids versus fissure versus other 2.  Screening for colorectal cancer: Patient is 47 never had screening for colon cancer  Plan: 1.  Scheduled patient for diagnostic colonoscopy given rectal bleeding.  This is scheduled with Dr. Meridee Score in the [facility].  Did provide the patient a detailed list of risks for the procedure and he agrees to proceed. Patient is appropriate for endoscopic procedure(s) in the ambulatory ([facility]) setting.  2.  Discussed external excoriation, this is very small, did bleed today on exam but I believe it will stop on its own without any other treatment.  Would recommend wiping gently around this area with a wet wipe for now and dabbing dry. 3.   Patient to follow in clinic per recommendations after time of colonoscopy.  Discussed with patient that if hemorrhoids are found and thought to be a problem they can be treated with suppository.  Hyacinth Meeker, PA-C [facility] 09/08/2023, 2:45 PM  Cc: Tresa Garter, MD

## 2023-09-08 NOTE — Progress Notes (Signed)
Attending Physician's Attestation   I have reviewed the chart.   I agree with the Advanced Practitioner's note, impression, and recommendations with any updates as below. Since he is 47, and not having significant symptoms currently but in the past, this will be a screening colonoscopy unless significant other findings are present.   Corliss Parish, MD [facility] Advanced Endoscopy Office # [phone]

## 2023-10-12 ENCOUNTER — Encounter: Payer: Self-pay | Admitting: Gastroenterology

## 2023-10-24 ENCOUNTER — Encounter: Payer: Self-pay | Admitting: Gastroenterology

## 2023-10-24 ENCOUNTER — Other Ambulatory Visit: Payer: Self-pay | Admitting: Gastroenterology

## 2023-10-24 ENCOUNTER — Ambulatory Visit: Payer: Managed Care, Other (non HMO) | Admitting: Gastroenterology

## 2023-10-24 VITALS — BP 132/91 | HR 70 | Temp 98.0°F | Resp 11 | Ht 70.0 in | Wt 213.6 lb

## 2023-10-24 DIAGNOSIS — K649 Unspecified hemorrhoids: Secondary | ICD-10-CM

## 2023-10-24 DIAGNOSIS — K641 Second degree hemorrhoids: Secondary | ICD-10-CM

## 2023-10-24 DIAGNOSIS — D127 Benign neoplasm of rectosigmoid junction: Secondary | ICD-10-CM

## 2023-10-24 DIAGNOSIS — Z1211 Encounter for screening for malignant neoplasm of colon: Secondary | ICD-10-CM | POA: Diagnosis present

## 2023-10-24 DIAGNOSIS — D122 Benign neoplasm of ascending colon: Secondary | ICD-10-CM

## 2023-10-24 DIAGNOSIS — D123 Benign neoplasm of transverse colon: Secondary | ICD-10-CM

## 2023-10-24 HISTORY — PX: COLONOSCOPY: SHX174

## 2023-10-24 MED ORDER — HYDROCORTISONE ACETATE 25 MG RE SUPP: RECTAL | 1 refills | Status: DC

## 2023-10-24 MED ORDER — SODIUM CHLORIDE 0.9 % IV SOLN: 500.0000 mL | Freq: Once | INTRAVENOUS | Status: DC

## 2023-10-24 NOTE — Patient Instructions (Addendum)
Start high fiber diet tomorrow, 12/25.  Use FiberCon 1-2 tablets daily (this medication is over the counter).  Follow up colonoscopy to be determined based on biopsy results.  Handouts provided on polyps, hemorrhoids, and high fiber diet.    YOU HAD AN ENDOSCOPIC PROCEDURE TODAY AT THE [facility]:   Refer to the procedure report that was given to you for any specific questions about what was found during the examination.  If the procedure report does not answer your questions, please call your gastroenterologist to clarify.  If you requested that your care partner not be given the details of your procedure findings, then the procedure report has been included in a sealed envelope for you to review at your convenience later.  YOU SHOULD EXPECT: Some feelings of bloating in the abdomen. Passage of more gas than usual.  Walking can help get rid of the air that was put into your GI tract during the procedure and reduce the bloating. If you had a lower endoscopy (such as a colonoscopy or flexible sigmoidoscopy) you may notice spotting of blood in your stool or on the toilet paper. If you underwent a bowel prep for your procedure, you may not have a normal bowel movement for a few days.  Please Note:  You might notice some irritation and congestion in your nose or some drainage.  This is from the oxygen used during your procedure.  There is no need for concern and it should clear up in a day or so.  SYMPTOMS TO REPORT IMMEDIATELY:  Following lower endoscopy (colonoscopy or flexible sigmoidoscopy):  Excessive amounts of blood in the stool  Significant tenderness or worsening of abdominal pains  Swelling of the abdomen that is new, acute  Fever of 100F or higher  For urgent or emergent issues, a gastroenterologist can be reached at any hour by calling [phone]. Do not use MyChart messaging for urgent concerns.    DIET:  We do recommend a small meal at first, but then you may proceed  to your regular diet.  Drink plenty of fluids but you should avoid alcoholic beverages for 24 hours.  ACTIVITY:  You should plan to take it easy for the rest of today and you should NOT DRIVE or use heavy machinery until tomorrow (because of the sedation medicines used during the test).    FOLLOW UP: Our staff will call the number listed on your records the next business day following your procedure.  We will call around 7:15- 8:00 am to check on you and address any questions or concerns that you may have regarding the information given to you following your procedure. If we do not reach you, we will leave a message.     If any biopsies were taken you will be contacted by phone or by letter within the next 1-3 weeks.  Please call us at [phone] if you have not heard about the biopsies in 3 weeks.    SIGNATURES/CONFIDENTIALITY: You and/or your care partner have signed paperwork which will be entered into your electronic medical record.  These signatures attest to the fact that that the information above on your After Visit Summary has been reviewed and is understood.  Full responsibility of the confidentiality of this discharge information lies with you and/or your care-partner.

## 2023-10-24 NOTE — Progress Notes (Unsigned)
   GASTROENTEROLOGY PROCEDURE H&P NOTE   Primary Care Physician: Tresa Garter, MD  HPI: Adam Romero is a 47 y.o. male who presents for Screening Colonoscopy.  Past Medical History:  Diagnosis Date   Kidney stone 2007   R   No past surgical history on file. Current Outpatient Medications  Medication Sig Dispense Refill   penicillin v potassium (VEETID) 500 MG tablet Take 500 mg by mouth 3 (three) times daily.     No current facility-administered medications for this visit.    Current Outpatient Medications:    penicillin v potassium (VEETID) 500 MG tablet, Take 500 mg by mouth 3 (three) times daily., Disp: , Rfl:  No Known Allergies Family History  Problem Relation Age of Onset   Stomach cancer Mother    Esophageal cancer Father    Social History   Socioeconomic History   Marital status: Married    Spouse name: Not on file   Number of children: 2   Years of education: Not on file   Highest education level: Not on file  Occupational History   Occupation: Charity fundraiser  Tobacco Use   Smoking status: Former   Smokeless tobacco: Never  Advertising account planner   Vaping status: Never Used  Substance and Sexual Activity   Alcohol use: Yes   Drug use: No   Sexual activity: Yes  Other Topics Concern   Not on file  Social History Narrative   Not on file   Social Drivers of Health   Financial Resource Strain: Not on file  Food Insecurity: Not on file  Transportation Needs: Not on file  Physical Activity: Not on file  Stress: Not on file  Social Connections: Not on file  Intimate Partner Violence: Not on file    Physical Exam: There were no vitals filed for this visit. There is no height or weight on file to calculate BMI. GEN: NAD EYE: Sclerae anicteric ENT: MMM CV: Non-tachycardic GI: Soft, NT/ND NEURO:  Alert & Oriented x 3  Lab Results: No results for input(s): "WBC", "HGB", "HCT", "PLT" in the last 72 hours. BMET No results for input(s): "NA", "K", "CL",  "CO2", "GLUCOSE", "BUN", "CREATININE", "CALCIUM" in the last 72 hours. LFT No results for input(s): "PROT", "ALBUMIN", "AST", "ALT", "ALKPHOS", "BILITOT", "BILIDIR", "IBILI" in the last 72 hours. PT/INR No results for input(s): "LABPROT", "INR" in the last 72 hours.   Impression / Plan: This is a 47 y.o.male who presents for Screening Colonoscopy.  The risks and benefits of endoscopic evaluation/treatment were discussed with the patient and/or family; these include but are not limited to the risk of perforation, infection, bleeding, missed lesions, lack of diagnosis, severe illness requiring hospitalization, as well as anesthesia and sedation related illnesses.  The patient's history has been reviewed, patient examined, no change in status, and deemed stable for procedure.  The patient and/or family is agreeable to proceed.    Corliss Parish, MD [facility] Advanced Endoscopy Office # [phone]

## 2023-10-24 NOTE — Progress Notes (Signed)
Called to room to assist during endoscopic procedure.  Patient ID and intended procedure confirmed with present staff. Received instructions for my participation in the procedure from the performing physician.  

## 2023-10-24 NOTE — Progress Notes (Unsigned)
Sedate, gd SR, tolerated procedure well, VSS, report to RN 

## 2023-10-24 NOTE — Op Note (Signed)
[facility] Patient Name: Adam Romero Procedure Date: 10/24/2023 11:49 AM MRN: [id number] Endoscopist: Corliss Parish , MD, [id number] Age: 48 Referring MD:  Date of Birth: Aug 20, 1976 Gender: Male Account #: [id number] Procedure:                Colonoscopy Indications:              Screening for colorectal malignant neoplasm Medicines:                Monitored Anesthesia Care Procedure:                Pre-Anesthesia Assessment:                           - Prior to the procedure, a History and Physical                            was performed, and patient medications and                            allergies were reviewed. The patient's tolerance of                            previous anesthesia was also reviewed. The risks                            and benefits of the procedure and the sedation                            options and risks were discussed with the patient.                            All questions were answered, and informed consent                            was obtained. Prior Anticoagulants: The patient has                            taken no anticoagulant or antiplatelet agents. ASA                            Grade Assessment: I - A normal, healthy patient.                            After reviewing the risks and benefits, the patient                            was deemed in satisfactory condition to undergo the                            procedure.                           After obtaining informed consent, the colonoscope  was passed under direct vision. Throughout the                            procedure, the patient's blood pressure, pulse, and                            oxygen saturations were monitored continuously. The                            CF [id number] #[id number] was introduced through the anus                            and advanced to the 3 cm into the ileum. The                            colonoscopy was  performed without difficulty. The                            patient tolerated the procedure. The quality of the                            bowel preparation was good. The terminal ileum,                            ileocecal valve, appendiceal orifice, and rectum                            were photographed. Scope In: 12:10:12 PM Scope Out: 12:24:11 PM Scope Withdrawal Time: 0 hours 11 minutes 37 seconds  Total Procedure Duration: 0 hours 13 minutes 59 seconds  Findings:                 The digital rectal exam findings include                            hemorrhoids. Pertinent negatives include no                            palpable rectal lesions.                           The terminal ileum and ileocecal valve appeared                            normal.                           Five sessile polyps were found in the recto-sigmoid                            colon (3) and transverse colon (2). The polyps were                            2 to 8 mm in size. These polyps were removed with a  cold snare. Resection and retrieval were complete.                           Normal mucosa was found in the entire colon                            otherwise.                           Non-bleeding non-thrombosed external and internal                            hemorrhoids were found during retroflexion, during                            perianal exam and during digital exam. The                            hemorrhoids were Grade II (internal hemorrhoids                            that prolapse but reduce spontaneously). Complications:            No immediate complications. Estimated Blood Loss:     Estimated blood loss was minimal. Impression:               - Hemorrhoids found on digital rectal exam.                           - The examined portion of the ileum was normal.                           - Five 2 to 8 mm polyps at the recto-sigmoid colon                            and  in the transverse colon, removed with a cold                            snare. Resected and retrieved.                           - Normal mucosa in the entire examined colon                            otherwise.                           - Non-bleeding non-thrombosed external and internal                            hemorrhoids. Recommendation:           - The patient will be observed post-procedure,                            until all discharge criteria are met.                           -  Discharge patient to home.                           - Patient has a contact number available for                            emergencies. The signs and symptoms of potential                            delayed complications were discussed with the                            patient. Return to normal activities tomorrow.                            Written discharge instructions were provided to the                            patient.                           - High fiber diet.                           - Use FiberCon 1-2 tablets PO daily.                           - Continue present medications.                           - Await pathology results.                           - Repeat colonoscopy in 3/5/7 years for                            surveillance based on pathology results.                           - The findings and recommendations were discussed                            with the patient.                           - The findings and recommendations were discussed                            with the patient's family. Corliss Parish, MD 10/24/2023 12:40:05 PM

## 2023-10-27 ENCOUNTER — Telehealth: Payer: Self-pay

## 2023-10-27 NOTE — Telephone Encounter (Signed)
Could not leave message - Mailbox is full

## 2023-10-30 ENCOUNTER — Encounter: Payer: Self-pay | Admitting: Gastroenterology

## 2023-10-30 LAB — SURGICAL PATHOLOGY

## 2023-12-07 ENCOUNTER — Ambulatory Visit
Admission: EM | Admit: 2023-12-07 | Discharge: 2023-12-07 | Disposition: A | Discharge status: Home / Self Care | Payer: Managed Care, Other (non HMO) | Attending: Nurse Practitioner | Admitting: Nurse Practitioner

## 2023-12-07 ENCOUNTER — Encounter: Payer: Self-pay | Admitting: Emergency Medicine

## 2023-12-07 DIAGNOSIS — R052 Subacute cough: Secondary | ICD-10-CM

## 2023-12-07 MED ORDER — PROMETHAZINE-DM 6.25-15 MG/5ML PO SYRP: 5.0000 mL | ORAL_SOLUTION | Freq: Every evening | ORAL | 0 refills | Status: DC | PRN

## 2023-12-07 MED ORDER — BENZONATATE 100 MG PO CAPS: 100.0000 mg | ORAL_CAPSULE | Freq: Three times a day (TID) | ORAL | 0 refills | Status: DC | PRN

## 2023-12-07 NOTE — Discharge Instructions (Signed)
 You most likely have a post viral cough.  You can take the Tessalon  perles during the day and cough suppressant medication at night time as needed for cough.   Symptoms should improve over the next few days.

## 2023-12-07 NOTE — ED Triage Notes (Signed)
 Productive Cough x 10 days with sore throat.  Has been taking mucinex with little relief.

## 2023-12-07 NOTE — ED Provider Notes (Signed)
 [facility]    CSN: [id number] Arrival date & time: 12/07/23  1337      History   Chief Complaint No chief complaint on file.   HPI Adam Romero is a 48 y.o. male.   Patient presents today with 10-day history of cough with very little mucus coming up.  He denies fever, body aches or chills, shortness of breath or chest pain, runny or stuffy nose, headache, ear pain, abdominal pain, vomiting, diarrhea, change in appetite.  He has been a little bit more tired than normal.  He has taken Mucinex at home and some over-the-counter vitamins and home products from a local grocery store with very minimal improvement.  He is requesting something to make the cough go away today because he feels bad going to work and coughing while at work.    Past Medical History:  Diagnosis Date   Kidney stone 2007   R    Patient Active Problem List   Diagnosis Date Noted   Finger pain, right 02/08/2022   Plantar fasciitis 02/08/2022   BRBPR (bright red blood per rectum) 02/08/2022   Chest pain, atypical 12/12/2019   Well adult exam 11/24/2016   Renal stones 11/24/2016   Abdominal pain, chronic, epigastric 11/24/2016   Diarrhea 11/24/2016    Past Surgical History:  Procedure Laterality Date   COLONOSCOPY  10/24/2023       Home Medications    Prior to Admission medications   Medication Sig Start Date End Date Taking? Authorizing Provider  benzonatate  (TESSALON ) 100 MG capsule Take 1 capsule (100 mg total) by mouth 3 (three) times daily as needed for cough. Do not take with alcohol or while operating or driving heavy machinery 05/02/73  Yes Chandra Harlene LABOR, NP  promethazine -dextromethorphan (PROMETHAZINE -DM) 6.25-15 MG/5ML syrup Take 5 mLs by mouth at bedtime as needed for cough. Do not take with alcohol or while driving or operating heavy machinery.  May cause drowsiness. 12/07/23  Yes Chandra Harlene LABOR, NP  hydrocortisone  (ANUSOL -HC) 25 MG suppository Use 1 suppository nighty  for 4 nights, then use as needed thereafter 10/26/23   Mansouraty, Aloha Raddle., MD    Family History Family History  Problem Relation Age of Onset   Stomach cancer Mother 25   Esophageal cancer Father 78   Colon cancer Neg Hx     Social History Social History   Tobacco Use   Smoking status: Former   Smokeless tobacco: Never  Vaping Use   Vaping status: Never Used  Substance Use Topics   Alcohol use: Yes    Comment: rarely   Drug use: No     Allergies   Patient has no known allergies.   Review of Systems Review of Systems Per HPI  Physical Exam Triage Vital Signs ED Triage Vitals  Encounter Vitals Group     BP 12/07/23 1423 121/83     Systolic BP Percentile --      Diastolic BP Percentile --      Pulse Rate 12/07/23 1423 94     Resp 12/07/23 1423 18     Temp 12/07/23 1423 98.5 F (36.9 C)     Temp Source 12/07/23 1423 Oral     SpO2 12/07/23 1423 94 %     Weight --      Height --      Head Circumference --      Peak Flow --      Pain Score 12/07/23 1424 5     Pain Loc --  Pain Education --      Exclude from Growth Chart --    No data found.  Updated Vital Signs BP 121/83 (BP Location: Right Arm)   Pulse 94   Temp 98.5 F (36.9 C) (Oral)   Resp 18   SpO2 94%   Visual Acuity Right Eye Distance:   Left Eye Distance:   Bilateral Distance:    Right Eye Near:   Left Eye Near:    Bilateral Near:     Physical Exam Vitals and nursing note reviewed.  Constitutional:      General: He is not in acute distress.    Appearance: Normal appearance. He is not ill-appearing or toxic-appearing.  HENT:     Head: Normocephalic and atraumatic.     Right Ear: Tympanic membrane, ear canal and external ear normal.     Left Ear: Tympanic membrane, ear canal and external ear normal.     Nose: No congestion or rhinorrhea.     Mouth/Throat:     Mouth: Mucous membranes are moist.     Pharynx: Oropharynx is clear. No oropharyngeal exudate or posterior  oropharyngeal erythema.  Eyes:     General: No scleral icterus.    Extraocular Movements: Extraocular movements intact.  Cardiovascular:     Rate and Rhythm: Normal rate and regular rhythm.  Pulmonary:     Effort: Pulmonary effort is normal. No respiratory distress.     Breath sounds: Normal breath sounds. No wheezing, rhonchi or rales.  Musculoskeletal:     Cervical back: Normal range of motion and neck supple.  Lymphadenopathy:     Cervical: No cervical adenopathy.  Skin:    General: Skin is warm and dry.     Coloration: Skin is not jaundiced or pale.     Findings: No erythema or rash.  Neurological:     Mental Status: He is alert and oriented to person, place, and time.  Psychiatric:        Behavior: Behavior is cooperative.      UC Treatments / Results  Labs (all labs ordered are listed, but only abnormal results are displayed) Labs Reviewed - No data to display  EKG   Radiology No results found.  Procedures Procedures (including critical care time)  Medications Ordered in UC Medications - No data to display  Initial Impression / Assessment and Plan / UC Course  I have reviewed the triage vital signs and the nursing notes.  Pertinent labs & imaging results that were available during my care of the patient were reviewed by me and considered in my medical decision making (see chart for details).   Patient is well-appearing, normotensive, afebrile, not tachycardic, not tachypneic, oxygenating well on room air.    1. Subacute cough Suspect likely postviral cough Supportive care discussed including Tessalon  Perles and Promethazine  DM cough syrup at nighttime Return and ER precautions discussed Patient declines needing a note for work  The patient was given the opportunity to ask questions.  All questions answered to their satisfaction.  The patient is in agreement to this plan.    Final Clinical Impressions(s) / UC Diagnoses   Final diagnoses:  Subacute cough      Discharge Instructions      You most likely have a post viral cough.  You can take the Tessalon  perles during the day and cough suppressant medication at night time as needed for cough.   Symptoms should improve over the next few days.       ED  Prescriptions     Medication Sig Dispense Auth. Provider   benzonatate  (TESSALON ) 100 MG capsule Take 1 capsule (100 mg total) by mouth 3 (three) times daily as needed for cough. Do not take with alcohol or while operating or driving heavy machinery 21 capsule Chandra Raisin A, NP   promethazine -dextromethorphan (PROMETHAZINE -DM) 6.25-15 MG/5ML syrup Take 5 mLs by mouth at bedtime as needed for cough. Do not take with alcohol or while driving or operating heavy machinery.  May cause drowsiness. 118 mL Chandra Raisin LABOR, NP      PDMP not reviewed this encounter.   Chandra Raisin LABOR, NP 12/07/23 [phone]

## 2024-02-22 ENCOUNTER — Ambulatory Visit (INDEPENDENT_AMBULATORY_CARE_PROVIDER_SITE_OTHER): Payer: Managed Care, Other (non HMO) | Admitting: Internal Medicine

## 2024-02-22 ENCOUNTER — Encounter: Payer: Self-pay | Admitting: Internal Medicine

## 2024-02-22 VITALS — BP 118/72 | HR 65 | Temp 97.9°F | Ht 70.0 in | Wt 211.6 lb

## 2024-02-22 DIAGNOSIS — Z13220 Encounter for screening for lipoid disorders: Secondary | ICD-10-CM

## 2024-02-22 DIAGNOSIS — K649 Unspecified hemorrhoids: Secondary | ICD-10-CM | POA: Insufficient documentation

## 2024-02-22 DIAGNOSIS — Z125 Encounter for screening for malignant neoplasm of prostate: Secondary | ICD-10-CM

## 2024-02-22 DIAGNOSIS — Z0001 Encounter for general adult medical examination with abnormal findings: Secondary | ICD-10-CM | POA: Diagnosis not present

## 2024-02-22 DIAGNOSIS — K635 Polyp of colon: Secondary | ICD-10-CM | POA: Insufficient documentation

## 2024-02-22 DIAGNOSIS — H00019 Hordeolum externum unspecified eye, unspecified eyelid: Secondary | ICD-10-CM | POA: Insufficient documentation

## 2024-02-22 DIAGNOSIS — Z0000 Encounter for general adult medical examination without abnormal findings: Secondary | ICD-10-CM

## 2024-02-22 DIAGNOSIS — H00015 Hordeolum externum left lower eyelid: Secondary | ICD-10-CM | POA: Diagnosis not present

## 2024-02-22 DIAGNOSIS — D126 Benign neoplasm of colon, unspecified: Secondary | ICD-10-CM | POA: Diagnosis not present

## 2024-02-22 LAB — LIPID PANEL
Cholesterol: 172 mg/dL (ref 0–200)
HDL: 37.1 mg/dL — ABNORMAL LOW (ref >39.00)
LDL Cholesterol: 113 mg/dL — ABNORMAL HIGH (ref 0–99)
NonHDL: 134.66
Total CHOL/HDL Ratio: 5
Triglycerides: 109 mg/dL (ref 0.0–149.0)
VLDL: 21.8 mg/dL (ref 0.0–40.0)

## 2024-02-22 LAB — CBC WITH DIFFERENTIAL/PLATELET
Basophils Absolute: 0 10*3/uL (ref 0.0–0.1)
Basophils Relative: 0.4 % (ref 0.0–3.0)
Eosinophils Absolute: 0.1 10*3/uL (ref 0.0–0.7)
Eosinophils Relative: 2.1 % (ref 0.0–5.0)
HCT: 45.3 % (ref 39.0–52.0)
Hemoglobin: 15.3 g/dL (ref 13.0–17.0)
Lymphocytes Relative: 36.6 % (ref 12.0–46.0)
Lymphs Abs: 1.9 10*3/uL (ref 0.7–4.0)
MCHC: 33.7 g/dL (ref 30.0–36.0)
MCV: 86 fl (ref 78.0–100.0)
Monocytes Absolute: 0.5 10*3/uL (ref 0.1–1.0)
Monocytes Relative: 8.9 % (ref 3.0–12.0)
Neutro Abs: 2.7 10*3/uL (ref 1.4–7.7)
Neutrophils Relative %: 52 % (ref 43.0–77.0)
Platelets: 200 10*3/uL (ref 150.0–400.0)
RBC: 5.26 Mil/uL (ref 4.22–5.81)
RDW: 13.2 % (ref 11.5–15.5)
WBC: 5.2 10*3/uL (ref 4.0–10.5)

## 2024-02-22 LAB — COMPREHENSIVE METABOLIC PANEL WITH GFR
ALT: 42 U/L (ref 0–53)
AST: 20 U/L (ref 0–37)
Albumin: 4.4 g/dL (ref 3.5–5.2)
Alkaline Phosphatase: 75 U/L (ref 39–117)
BUN: 20 mg/dL (ref 6–23)
CO2: 28 meq/L (ref 19–32)
Calcium: 9.3 mg/dL (ref 8.4–10.5)
Chloride: 104 meq/L (ref 96–112)
Creatinine, Ser: 0.94 mg/dL (ref 0.40–1.50)
GFR: 97.43 mL/min (ref >60.00)
Glucose, Bld: 100 mg/dL — ABNORMAL HIGH (ref 70–99)
Potassium: 4 meq/L (ref 3.5–5.1)
Sodium: 139 meq/L (ref 135–145)
Total Bilirubin: 0.9 mg/dL (ref 0.2–1.2)
Total Protein: 7 g/dL (ref 6.0–8.3)

## 2024-02-22 LAB — URINALYSIS
Bilirubin Urine: NEGATIVE
Hgb urine dipstick: NEGATIVE
Ketones, ur: NEGATIVE
Leukocytes,Ua: NEGATIVE
Nitrite: NEGATIVE
Specific Gravity, Urine: 1.02 (ref 1.000–1.030)
Total Protein, Urine: NEGATIVE
Urine Glucose: NEGATIVE
Urobilinogen, UA: 0.2 (ref 0.0–1.0)
pH: 6.5 (ref 5.0–8.0)

## 2024-02-22 LAB — PSA: PSA: 1.28 ng/mL (ref 0.10–4.00)

## 2024-02-22 LAB — TSH: TSH: 0.94 u[IU]/mL (ref 0.35–5.50)

## 2024-02-22 MED ORDER — ERYTHROMYCIN 5 MG/GM OP OINT: 1.0000 | TOPICAL_OINTMENT | Freq: Every day | OPHTHALMIC | 0 refills | Status: AC

## 2024-02-22 MED ORDER — HYDROCORTISONE ACETATE 25 MG RE SUPP: 25.0000 mg | Freq: Two times a day (BID) | RECTAL | 1 refills | Status: AC | PRN

## 2024-02-22 MED ORDER — TRIAMCINOLONE ACETONIDE 0.1 % EX OINT: TOPICAL_OINTMENT | CUTANEOUS | 2 refills | Status: AC

## 2024-02-22 MED ORDER — DOXYCYCLINE HYCLATE 100 MG PO TABS: 100.0000 mg | ORAL_TABLET | Freq: Two times a day (BID) | ORAL | 0 refills | Status: AC

## 2024-02-22 NOTE — Progress Notes (Signed)
 Subjective:  Patient ID: Adam Romero, male    DOB: [birth year]  Age: 48 y.o. MRN: [id number]  CC: Annual Exam (Annual Exam. Would like colonoscopy results reviewed, patient noted that 5 polyps removed but was not sure if they were tested)   HPI Adam Romero presents for a well exam C/o eye pain   Outpatient Medications Prior to Visit  Medication Sig Dispense Refill   benzonatate  (TESSALON ) 100 MG capsule Take 1 capsule (100 mg total) by mouth 3 (three) times daily as needed for cough. Do not take with alcohol or while operating or driving heavy machinery (Patient not taking: Reported on 02/22/2024) 21 capsule 0   hydrocortisone  (ANUSOL -HC) 25 MG suppository Use 1 suppository nighty for 4 nights, then use as needed thereafter (Patient not taking: Reported on 02/22/2024) 12 suppository 1   promethazine -dextromethorphan (PROMETHAZINE -DM) 6.25-15 MG/5ML syrup Take 5 mLs by mouth at bedtime as needed for cough. Do not take with alcohol or while driving or operating heavy machinery.  May cause drowsiness. (Patient not taking: Reported on 02/22/2024) 118 mL 0   0.9 %  sodium chloride  infusion      No facility-administered medications prior to visit.    ROS: Review of Systems  Constitutional:  Negative for appetite change, fatigue and unexpected weight change.  HENT:  Negative for congestion, nosebleeds, sneezing, sore throat and trouble swallowing.   Eyes:  Negative for itching and visual disturbance.  Respiratory:  Negative for cough.   Cardiovascular:  Negative for chest pain, palpitations and leg swelling.  Gastrointestinal:  Negative for abdominal distention, blood in stool, diarrhea and nausea.  Genitourinary:  Negative for frequency and hematuria.  Musculoskeletal:  Negative for back pain, gait problem, joint swelling and neck pain.  Skin:  Negative for rash.  Neurological:  Positive for weakness. Negative for dizziness, tremors and speech difficulty.  Psychiatric/Behavioral:   Negative for agitation, dysphoric mood and sleep disturbance. The patient is not nervous/anxious.     Objective:  BP 118/72   Pulse 65   Temp 97.9 F (36.6 C)   Ht 5\' 10"  (1.778 m)   Wt 211 lb 9.6 oz (96 kg)   SpO2 99%   BMI 30.36 kg/m   BP Readings from Last 3 Encounters:  02/22/24 118/72  12/07/23 121/83  10/24/23 (!) 132/91    Wt Readings from Last 3 Encounters:  02/22/24 211 lb 9.6 oz (96 kg)  10/24/23 213 lb 9.6 oz (96.9 kg)  09/08/23 213 lb 6 oz (96.8 kg)    Physical Exam Constitutional:      General: He is not in acute distress.    Appearance: Normal appearance. He is well-developed. He is not diaphoretic.  HENT:     Head: Normocephalic and atraumatic.     Right Ear: External ear normal.     Left Ear: External ear normal.     Nose: Nose normal.     Mouth/Throat:     Pharynx: No oropharyngeal exudate.  Eyes:     General: No scleral icterus.       Right eye: No discharge.        Left eye: No discharge.     Conjunctiva/sclera: Conjunctivae normal.     Pupils: Pupils are equal, round, and reactive to light.  Neck:     Thyroid : No thyromegaly.     Vascular: No JVD.     Trachea: No tracheal deviation.  Cardiovascular:     Rate and Rhythm: Normal rate and regular rhythm.  Heart sounds: Normal heart sounds. No murmur heard.    No friction rub. No gallop.  Pulmonary:     Effort: Pulmonary effort is normal. No respiratory distress.     Breath sounds: Normal breath sounds. No stridor. No wheezing or rales.  Chest:     Chest wall: No tenderness.  Abdominal:     General: Bowel sounds are normal. There is no distension.     Palpations: Abdomen is soft. There is no mass.     Tenderness: There is no abdominal tenderness. There is no guarding or rebound.  Genitourinary:    Penis: Normal. No tenderness.      Prostate: Normal.     Rectum: Normal. Guaiac result negative.  Musculoskeletal:        General: No tenderness. Normal range of motion.     Cervical back:  Normal range of motion and neck supple.  Lymphadenopathy:     Cervical: No cervical adenopathy.  Skin:    General: Skin is warm and dry.     Coloration: Skin is not pale.     Findings: No erythema or rash.  Neurological:     Mental Status: He is alert and oriented to person, place, and time.     Cranial Nerves: No cranial nerve deficit.     Motor: No abnormal muscle tone.     Coordination: Coordination normal.     Deep Tendon Reflexes: Reflexes are normal and symmetric. Reflexes normal.  Psychiatric:        Behavior: Behavior normal.        Thought Content: Thought content normal.        Judgment: Judgment normal.    L stye Lab Results  Component Value Date   WBC 4.5 02/16/2023   HGB 15.5 02/16/2023   HCT 46.1 02/16/2023   PLT 181.0 02/16/2023   GLUCOSE 90 02/16/2023   CHOL 118 02/16/2023   TRIG 62.0 02/16/2023   HDL 34.10 (L) 02/16/2023   LDLCALC 72 02/16/2023   ALT 35 02/16/2023   AST 23 02/16/2023   NA 138 02/16/2023   K 3.5 02/16/2023   CL 104 02/16/2023   CREATININE 1.01 02/16/2023   BUN 18 02/16/2023   CO2 27 02/16/2023   TSH 1.29 02/16/2023   PSA 0.57 02/04/2021    No results found.  Assessment & Plan:   Problem List Items Addressed This Visit     Well adult exam - Primary   We discussed age appropriate health related issues, including available/recomended screening tests and vaccinations. Labs were ordered to be later reviewed . All questions were answered. We discussed one or more of the following - seat belt use, use of sunscreen/sun exposure exercise, safe sex, fall risk reduction, second hand smoke exposure, firearm use and storage, seat belt use, a need for adhering to healthy diet and exercise. Labs were ordered.  All questions were answered. Remote h/o XRT exposure in [place] at 48 yo - he will calculate the dose Coronary calcium CT info was given 4/22 Last colon 2024 Dr Brice Campi  Due in 2027      Relevant Orders   TSH   Urinalysis   CBC  with Differential/Platelet   Lipid panel   Comprehensive metabolic panel with GFR   TSH   Urinalysis   CBC with Differential/Platelet   Lipid panel   PSA   Comprehensive metabolic panel with GFR   Colon polyps   Last colon 2024 Dr Brice Campi  Due in 2027  Hemorrhoids   Anusol  HC Triamc oint      Stye external   Doxy po Erythro ophth oint         Meds ordered this encounter  Medications   hydrocortisone  (ANUSOL -HC) 25 MG suppository    Sig: Place 1 suppository (25 mg total) rectally 2 (two) times daily as needed for hemorrhoids or anal itching.    Dispense:  20 suppository    Refill:  1   triamcinolone  ointment (KENALOG ) 0.1 %    Sig: Use every day - bid    Dispense:  80 g    Refill:  2   doxycycline  (VIBRA -TABS) 100 MG tablet    Sig: Take 1 tablet (100 mg total) by mouth 2 (two) times daily.    Dispense:  20 tablet    Refill:  0   erythromycin  ophthalmic ointment    Sig: Place 1 Application into both eyes at bedtime.    Dispense:  3.5 g    Refill:  0      Follow-up: Return in about 1 year (around 02/21/2025) for Wellness Exam.  Anitra Barn, MD

## 2024-02-22 NOTE — Assessment & Plan Note (Signed)
 Doxy po Erythro ophth oint

## 2024-02-22 NOTE — Assessment & Plan Note (Signed)
 Anusol  HC Triamc oint

## 2024-02-22 NOTE — Assessment & Plan Note (Signed)
 Last colon 2024 Dr Brice Campi  Due in 2027

## 2024-02-22 NOTE — Assessment & Plan Note (Signed)
 We discussed age appropriate health related issues, including available/recomended screening tests and vaccinations. Labs were ordered to be later reviewed . All questions were answered. We discussed one or more of the following - seat belt use, use of sunscreen/sun exposure exercise, safe sex, fall risk reduction, second hand smoke exposure, firearm use and storage, seat belt use, a need for adhering to healthy diet and exercise. Labs were ordered.  All questions were answered. Remote h/o XRT exposure in [place] at 48 yo - he will calculate the dose Coronary calcium CT info was given 4/22 Last colon 2024 Dr Brice Campi  Due in 2027

## 2024-02-25 ENCOUNTER — Encounter: Payer: Self-pay | Admitting: Internal Medicine

## 2024-12-03 ENCOUNTER — Other Ambulatory Visit: Payer: Self-pay

## 2024-12-03 ENCOUNTER — Ambulatory Visit
Admission: EM | Admit: 2024-12-03 | Discharge: 2024-12-03 | Disposition: A | Discharge status: Home / Self Care | Source: Home / Self Care

## 2024-12-03 ENCOUNTER — Encounter: Payer: Self-pay | Admitting: Emergency Medicine

## 2024-12-03 DIAGNOSIS — H6122 Impacted cerumen, left ear: Secondary | ICD-10-CM | POA: Diagnosis not present

## 2024-12-03 DIAGNOSIS — H66001 Acute suppurative otitis media without spontaneous rupture of ear drum, right ear: Secondary | ICD-10-CM

## 2024-12-03 MED ORDER — AMOXICILLIN 875 MG PO TABS: 875.0000 mg | ORAL_TABLET | Freq: Two times a day (BID) | ORAL | 0 refills | Status: AC

## 2025-02-25 ENCOUNTER — Encounter: Admitting: Internal Medicine
